# Patient Record
Sex: Male | Born: 1970 | Race: White | Hispanic: No | Marital: Married | Smoking: Never smoker
Health system: Southern US, Community
[De-identification: ages and names within clinical notes are randomized; demographics above are authoritative.]

## PROBLEM LIST (undated history)

## (undated) DIAGNOSIS — R519 Headache, unspecified: Secondary | ICD-10-CM

## (undated) DIAGNOSIS — R51 Headache: Secondary | ICD-10-CM

## (undated) HISTORY — DX: Headache, unspecified: R51.9

## (undated) HISTORY — DX: Headache: R51

---

## 2015-12-29 ENCOUNTER — Encounter: Payer: Self-pay | Admitting: Podiatry

## 2015-12-29 ENCOUNTER — Ambulatory Visit (INDEPENDENT_AMBULATORY_CARE_PROVIDER_SITE_OTHER): Payer: 59 | Admitting: Podiatry

## 2015-12-29 VITALS — BP 147/90 | HR 70 | Ht 74.0 in | Wt 210.0 lb

## 2015-12-29 DIAGNOSIS — M216X9 Other acquired deformities of unspecified foot: Secondary | ICD-10-CM | POA: Insufficient documentation

## 2015-12-29 DIAGNOSIS — M722 Plantar fascial fibromatosis: Secondary | ICD-10-CM | POA: Diagnosis not present

## 2015-12-29 DIAGNOSIS — M21969 Unspecified acquired deformity of unspecified lower leg: Secondary | ICD-10-CM | POA: Insufficient documentation

## 2015-12-29 NOTE — Patient Instructions (Signed)
Seen for right heel pain. Reviewed findings.  Need custom orthotics or OTC.

## 2015-12-29 NOTE — Progress Notes (Signed)
  SUBJECTIVE: 45 y.o. year old male presents complaining of right heel pain at plantar medial since September (4 months). On feet at work, 5-6 hours. Has had plantar fasciitis, pain in the mornging and gets better after stretch. This was about 4 years ago. Seen podiatrist at that time and was placed in strapping and cortisone injection.  Now the pain is more after been on feet and shoots up to Achilles and lateral aspect of calf area. Has history of frequent ankle sprain as a child.   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 Neurological: negative Endocrine: negative Allergic/Immunologic: negative  OBJECTIVE: DERMATOLOGIC EXAMINATION: No open skin lesions. Positive of firm palpable mass, 1x2 cm  plantar medial aspect at the level of 1st Metatarsal neck left foot. Not painful.   VASCULAR EXAMINATION OF LOWER LIMBS: Pedal pulses: All pedal pulses are palpable with normal pulsation.  Temperature gradient from tibial crest to dorsum of foot is within normal bilateral.  NEUROLOGIC EXAMINATION OF THE LOWER LIMBS: All epicritic and tactile sensations grossly intact.   MUSCULOSKELETAL EXAMINATION: Positive of Ligamentous laxity. Positive of hypermobile first ray bilateral. Plantar medial heel pain right foot.  ASSESSMENT: Plantar fasciitis right. Hypermobile first ray bilateral. STJ hyperpronation bilateral.  PLAN: Reviewed clinical findings and available treatment options. Patient already has Metatarsal binder, which he will use. Return for custom orthotics.

## 2016-04-12 ENCOUNTER — Emergency Department
Admission: EM | Admit: 2016-04-12 | Discharge: 2016-04-12 | Disposition: A | Discharge status: Home / Self Care | Payer: 59 | Attending: Emergency Medicine | Admitting: Emergency Medicine

## 2016-04-12 DIAGNOSIS — R509 Fever, unspecified: Secondary | ICD-10-CM | POA: Insufficient documentation

## 2016-04-12 DIAGNOSIS — B349 Viral infection, unspecified: Secondary | ICD-10-CM

## 2016-04-12 NOTE — ED Notes (Signed)
Patient states chills since yesterday. Denies sore throat, chillls or dysuria.

## 2016-04-12 NOTE — ED Provider Notes (Signed)
[facility] [address]Emergency Department Provider Note ____________________________________________  Time seen: 1716  I have reviewed the triage vital signs and the nursing notes.  HISTORY  Chief Complaint  Fever  HPI Kent Floyd is a 45 y.o. male presents to the ED for evaluation of intermittent fevers since Sunday night. He describes a Tmax of 101 on Sunday night, and a onset this morning of fever at 100.23F. Patient did not receive the seasonal flu vaccine last year. He has also had intermittent headaches, as well as chills when the fevers spiked.He denies any nausea, vomiting, diarrhea, constipation, rashes. He denies any recent travel, sick contacts, bad food. He is been dosing BC powders for headaches and fevers with good response. His last dose was this morning at 8:30 AM.  History reviewed. No pertinent past medical history.  Patient Active Problem List   Diagnosis Date Noted  . Plantar fasciitis of right foot 12/29/2015  . 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 No pertinent past surgical history.  No current outpatient prescriptions on file.  Allergies Tree extract  No family history on file.  Social History Social History  Substance Use Topics  . Smoking status: Never Smoker   . Smokeless tobacco: Never Used  . Alcohol Use: 0.0 oz/week    0 Standard drinks or equivalent per week     Comment: socially   Review of Systems  Constitutional: Positive for fever. Eyes: Negative for visual changes. ENT: Negative for sore throat. Cardiovascular: Negative for chest pain. Respiratory: Negative for shortness of breath. Gastrointestinal: Negative for abdominal pain, vomiting and diarrhea. Genitourinary: Negative for dysuria. Musculoskeletal: Negative for back pain. Skin: Negative for rash. Neurological: Negative for headaches, focal weakness or  numbness. ____________________________________________  PHYSICAL EXAM:  VITAL SIGNS: ED Triage Vitals  Enc Vitals Group     BP 04/12/16 1546 137/95 mmHg     Pulse Rate 04/12/16 1546 111     Resp 04/12/16 1546 18     Temp 04/12/16 1546 98.9 F (37.2 C)     Temp Source 04/12/16 1546 Oral     SpO2 04/12/16 1546 97 %     Weight 04/12/16 1546 200 lb (90.719 kg)     Height 04/12/16 1546 6\' 2"  (1.88 m)     Head Cir --      Peak Flow --      Pain Score 04/12/16 1742 0     Pain Loc --      Pain Edu? --      Excl. in [place]? --    Constitutional: Alert and oriented. Well appearing and in no distress. Head: Normocephalic and atraumatic.      Eyes: Conjunctivae are normal. PERRL. Normal extraocular movements      Ears: Canals clear. TMs intact bilaterally.   Nose: No congestion/rhinorrhea.   Mouth/Throat: Mucous membranes are moist.   Neck: Supple. No thyromegaly. Hematological/Lymphatic/Immunological: No cervical lymphadenopathy. Cardiovascular: Normal rate, regular rhythm.  Respiratory: Normal respiratory effort. No wheezes/rales/rhonchi. Gastrointestinal: Soft and nontender. No distention, rebound, guarding, or organomegaly. Musculoskeletal: Nontender with normal range of motion in all extremities.  Neurologic:  Normal gait without ataxia. Normal speech and language. No gross focal neurologic deficits are appreciated. Skin:  Skin is warm, dry and intact. No rash noted. ____________________________________________  INITIAL IMPRESSION / ASSESSMENT AND PLAN / ED COURSE  Patient with an apparent febrile illness which is likely viral in nature. He may affect have influenza however he is declined  testing at this time. Patient will be discharged with instructions to continue to monitor and treat fevers as appropriate. He should increase fluid intake to prevent dehydration. He Street any other symptoms as necessary and follow-up with [facility] for ongoing symptom management. He should return  to the ED as needed for worsening symptoms as discussed. ____________________________________________  FINAL CLINICAL IMPRESSION(S) / ED DIAGNOSES  Final diagnoses:  Nonspecific syndrome suggestive of viral illness      Melvenia Needles, PA-C 04/12/16 1830  Lisa Roca, MD 04/12/16 2026

## 2016-04-12 NOTE — Discharge Instructions (Signed)
Viral Infections A viral infection can be caused by different types of viruses.Most viral infections are not serious and resolve on their own. However, some infections may cause severe symptoms and may lead to further complications. SYMPTOMS Viruses can frequently cause:  Minor sore throat.  Aches and pains.  Headaches.  Runny nose.  Different types of rashes.  Watery eyes.  Tiredness.  Cough.  Loss of appetite.  Gastrointestinal infections, resulting in nausea, vomiting, and diarrhea. These symptoms do not respond to antibiotics because the infection is not caused by bacteria. However, you might catch a bacterial infection following the viral infection. This is sometimes called a "superinfection." Symptoms of such a bacterial infection may include:  Worsening sore throat with pus and difficulty swallowing.  Swollen neck glands.  Chills and a high or persistent fever.  Severe headache.  Tenderness over the sinuses.  Persistent overall ill feeling (malaise), muscle aches, and tiredness (fatigue).  Persistent cough.  Yellow, green, or brown mucus production with coughing. HOME CARE INSTRUCTIONS   Only take over-the-counter or prescription medicines for pain, discomfort, diarrhea, or fever as directed by your caregiver.  Drink enough water and fluids to keep your urine clear or pale yellow. Sports drinks can provide valuable electrolytes, sugars, and hydration.  Get plenty of rest and maintain proper nutrition. Soups and broths with crackers or rice are fine. SEEK IMMEDIATE MEDICAL CARE IF:   You have severe headaches, shortness of breath, chest pain, neck pain, or an unusual rash.  You have uncontrolled vomiting, diarrhea, or you are unable to keep down fluids.  You or your child has an oral temperature above 102 F (38.9 C), not controlled by medicine.  Your baby is older than 3 months with a rectal temperature of 102 F (38.9 C) or higher.  Your baby is 97  months old or younger with a rectal temperature of 100.4 F (38 C) or higher. MAKE SURE YOU:   Understand these instructions.  Will watch your condition.  Will get help right away if you are not doing well or get worse.   This information is not intended to replace advice given to you by your health care provider. Make sure you discuss any questions you have with your health care provider.   Document Released: 09/15/2005 Document Revised: 02/28/2012 Document Reviewed: 05/14/2015 Elsevier Interactive Patient Education 2016 Ten Sleep exam is essentially normal. You may, in fact, have influenza. Continue to monitor symptoms and treat fevers as needed. Follow-up with [facility] or return as needed.

## 2016-04-12 NOTE — ED Notes (Signed)
Pt c/o fever bodyaches since yesterday, states temp at home was 100.6-101, non febrile in triage

## 2016-04-12 NOTE — ED Notes (Signed)
PA Jenise informed of incr temperature, pt advised to take tylenol/ibuprofen for fever.  Pt cleared for D/C.

## 2016-10-28 ENCOUNTER — Ambulatory Visit (INDEPENDENT_AMBULATORY_CARE_PROVIDER_SITE_OTHER): Payer: 59 | Admitting: Sports Medicine

## 2016-11-02 ENCOUNTER — Ambulatory Visit (INDEPENDENT_AMBULATORY_CARE_PROVIDER_SITE_OTHER): Payer: 59 | Admitting: Sports Medicine

## 2017-08-05 ENCOUNTER — Encounter (HOSPITAL_COMMUNITY): Payer: Self-pay | Admitting: Emergency Medicine

## 2017-08-05 ENCOUNTER — Ambulatory Visit (HOSPITAL_COMMUNITY)
Admission: EM | Admit: 2017-08-05 | Discharge: 2017-08-05 | Disposition: A | Discharge status: Home / Self Care | Payer: Managed Care, Other (non HMO) | Attending: Family Medicine | Admitting: Family Medicine

## 2017-08-05 DIAGNOSIS — R6883 Chills (without fever): Secondary | ICD-10-CM

## 2017-08-05 DIAGNOSIS — R110 Nausea: Secondary | ICD-10-CM | POA: Diagnosis not present

## 2017-08-05 LAB — POCT I-STAT, CHEM 8
BUN: 11 mg/dL (ref 6–20)
CALCIUM ION: 1.13 mmol/L — AB (ref 1.15–1.40)
CHLORIDE: 103 mmol/L (ref 101–111)
CREATININE: 0.9 mg/dL (ref 0.61–1.24)
GLUCOSE: 115 mg/dL — AB (ref 65–99)
HCT: 45 % (ref 39.0–52.0)
HEMOGLOBIN: 15.3 g/dL (ref 13.0–17.0)
Potassium: 3.8 mmol/L (ref 3.5–5.1)
Sodium: 140 mmol/L (ref 135–145)
TCO2: 26 mmol/L (ref 0–100)

## 2017-08-05 LAB — POCT URINALYSIS DIP (DEVICE)
Bilirubin Urine: NEGATIVE
GLUCOSE, UA: NEGATIVE mg/dL
Hgb urine dipstick: NEGATIVE
Ketones, ur: 80 mg/dL — AB
Leukocytes, UA: NEGATIVE
Nitrite: NEGATIVE
PROTEIN: NEGATIVE mg/dL
SPECIFIC GRAVITY, URINE: 1.015 (ref 1.005–1.030)
UROBILINOGEN UA: 0.2 mg/dL (ref 0.0–1.0)
pH: 7.5 (ref 5.0–8.0)

## 2017-08-05 NOTE — ED Triage Notes (Signed)
Pt c/o intermittent weakness onset 1 week associated w/shakiness, chills and intermittent rash on hands and feet  Denies fevers, cold sx  Drinking Gatorade and alka seltzer w/temp relief.   A&O x4.... NAD... Ambulatory

## 2017-08-05 NOTE — ED Provider Notes (Signed)
[place]    CSN: [id number] Arrival date & time: 08/05/17  1003     History   Chief Complaint Chief Complaint  Patient presents with  . Weakness    HPI Kent Floyd is a 46 y.o. male.   46 year old male comes in for 2 shaking/chills/weakness episodes in the past week. He states that it has happened twice when he woke up at night shaking, with chills, and nauseous feeling. He denies any chest pain, shortness of breath, palpitations. Denies dizziness, syncope. States that he used Alka-Seltzer p.m. which helped with him falling back asleep. He states the next day he felt slightly nauseous, but symptoms had relatively resolved. He states drinking fluids seem to help, so he tried to stay as hydrated as possible. Second episode was last night, where again he felt shaking and chills. Denies abdominal pain, vomiting, diarrhea, constipation. Denies urinary changes such as frequency, dysuria, hematuria. Denies recent bug bite/tick bites.      History reviewed. No pertinent past medical history.  Patient Active Problem List   Diagnosis Date Noted  . Plantar fasciitis of right foot 12/29/2015  . 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 No pertinent surgical history.     Home Medications    Prior to Admission medications   Not on File    Family History History reviewed. No pertinent family history.  Social History Social History  Substance Use Topics  . Smoking status: Never Smoker  . Smokeless tobacco: Never Used  . Alcohol use 0.0 oz/week     Comment: socially     Allergies   Tree extract   Review of Systems Review of Systems  Reason unable to perform ROS: See HPI as above.     Physical Exam Triage Vital Signs ED Triage Vitals [08/05/17 1034]  Enc Vitals Group     BP 140/85     Pulse Rate 76     Resp 20     Temp 98.9 F (37.2 C)     Temp Source Oral     SpO2 100 %   Weight      Height      Head Circumference      Peak Flow      Pain Score      Pain Loc      Pain Edu?      Excl. in [place]?    Orthostatic VS for the past 24 hrs:  BP- Lying Pulse- Lying BP- Sitting Pulse- Sitting BP- Standing at 0 minutes Pulse- Standing at 0 minutes  08/05/17 1118 142/83 68 137/90 75 125/86 81    Updated Vital Signs BP 140/85 (BP Location: Left Arm)   Pulse 76   Temp 98.9 F (37.2 C) (Oral)   Resp 20   SpO2 100%      Physical Exam  Constitutional: He is oriented to person, place, and time. He appears well-developed and well-nourished. No distress.  HENT:  Head: Normocephalic and atraumatic.  Eyes: Pupils are equal, round, and reactive to light. Conjunctivae and EOM are normal.  Cardiovascular: Normal rate, regular rhythm and normal heart sounds.  Exam reveals no gallop and no friction rub.   No murmur heard. Pulmonary/Chest: Effort normal and breath sounds normal. He has no wheezes. He has no rales.  Neurological: He is alert and oriented to person, place, and time. He has normal strength. He is not disoriented. No cranial nerve deficit or sensory deficit. He displays  a negative Romberg sign.  Skin: Skin is warm and dry.     UC Treatments / Results  Labs (all labs ordered are listed, but only abnormal results are displayed) Labs Reviewed  POCT I-STAT, CHEM 8 - Abnormal; Notable for the following:       Result Value   Glucose, Bld 115 (*)    Calcium, Ion 1.13 (*)    All other components within normal limits  POCT URINALYSIS DIP (DEVICE) - Abnormal; Notable for the following:    Ketones, ur 80 (*)    All other components within normal limits    EKG  EKG Interpretation None       Radiology No results found.  Procedures Procedures (including critical care time)  Medications Ordered in UC Medications - No data to display   Initial Impression / Assessment and Plan / UC Course  I have reviewed the triage vital signs and the nursing  notes.  Pertinent labs & imaging results that were available during my care of the patient were reviewed by me and considered in my medical decision making (see chart for details).    Discussed lab results with patient. Given testing were within normal limits, patient without acute distress and without symptoms currently, discussed with patient to continue to monitor symptoms. Orthostatic blood pressure indicated possible dehydration, patient to push fluids. Return precautions given. Resources for PCP given.   Discussed case with attending, who agrees to plan.   Final Clinical Impressions(s) / UC Diagnoses   Final diagnoses:  Shaking chills    New Prescriptions New Prescriptions   No medications on file      Arturo Morton 08/05/17 1234

## 2017-08-05 NOTE — Discharge Instructions (Signed)
Your blood test and urine test were negative for any acute processes. You could have a viral illness that is causing the symptoms. Your blood pressure showed that you are slightly dehydrated. Drink fluids, your urine should be clear to pale yellow in color. Take tylenol/motrin for pain and fever. Monitor for any changes in symptoms, worsening symptoms, abdominal pain, nausea, vomiting, weakness, dizziness, follow up for reevaluation.

## 2017-10-19 ENCOUNTER — Ambulatory Visit
Admission: RE | Admit: 2017-10-19 | Discharge: 2017-10-19 | Disposition: A | Discharge status: Home / Self Care | Payer: Managed Care, Other (non HMO) | Source: Ambulatory Visit | Attending: Family Medicine | Admitting: Family Medicine

## 2017-10-19 ENCOUNTER — Other Ambulatory Visit: Payer: Self-pay | Admitting: Family Medicine

## 2017-10-19 DIAGNOSIS — M549 Dorsalgia, unspecified: Secondary | ICD-10-CM

## 2017-10-19 IMAGING — CR DG THORACIC SPINE 3V
3 series · 3 of 3 positions shown · non-contrast
Comparison: None.

CLINICAL DATA: Back pain.  No known injury.

EXAM:
THORACIC SPINE - 3 VIEWS

[t t-spine a.p.]
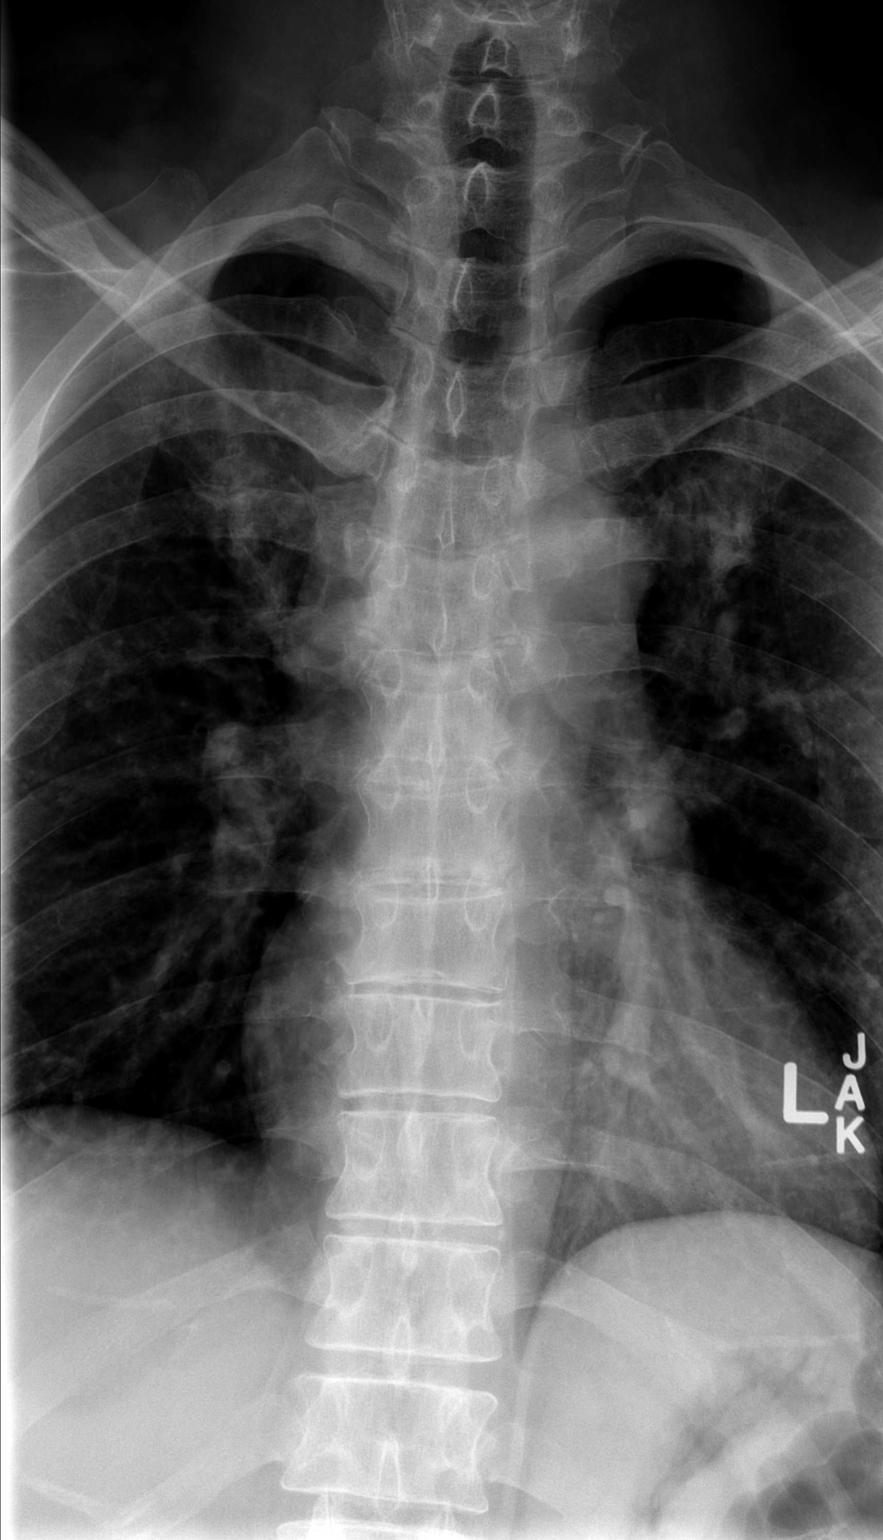

[t t-spine lat *]
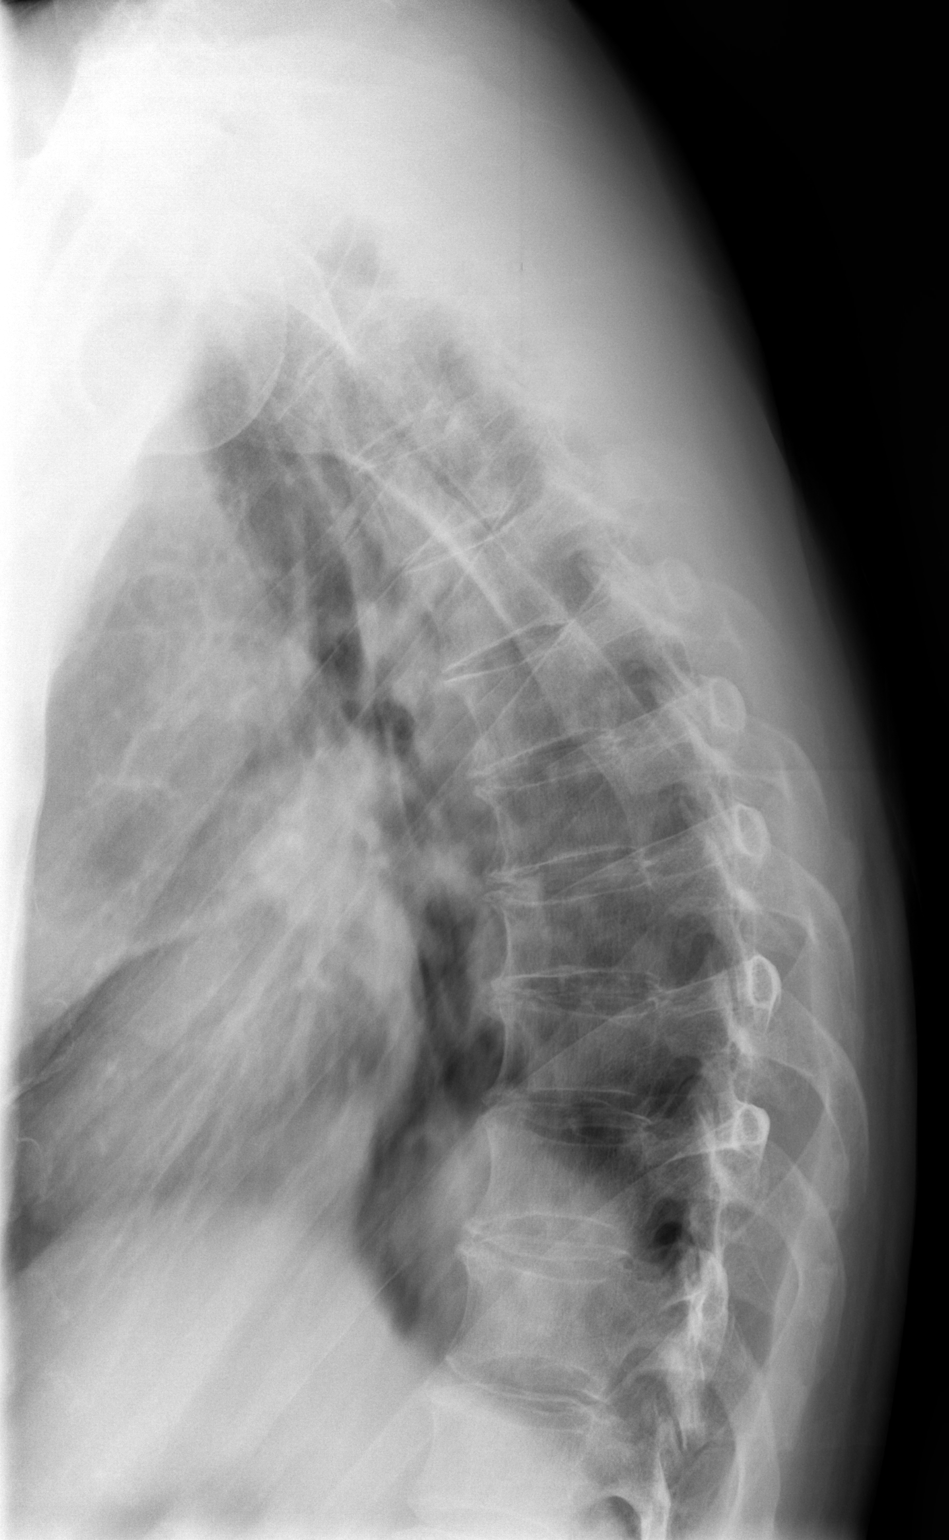

[t swimmers]
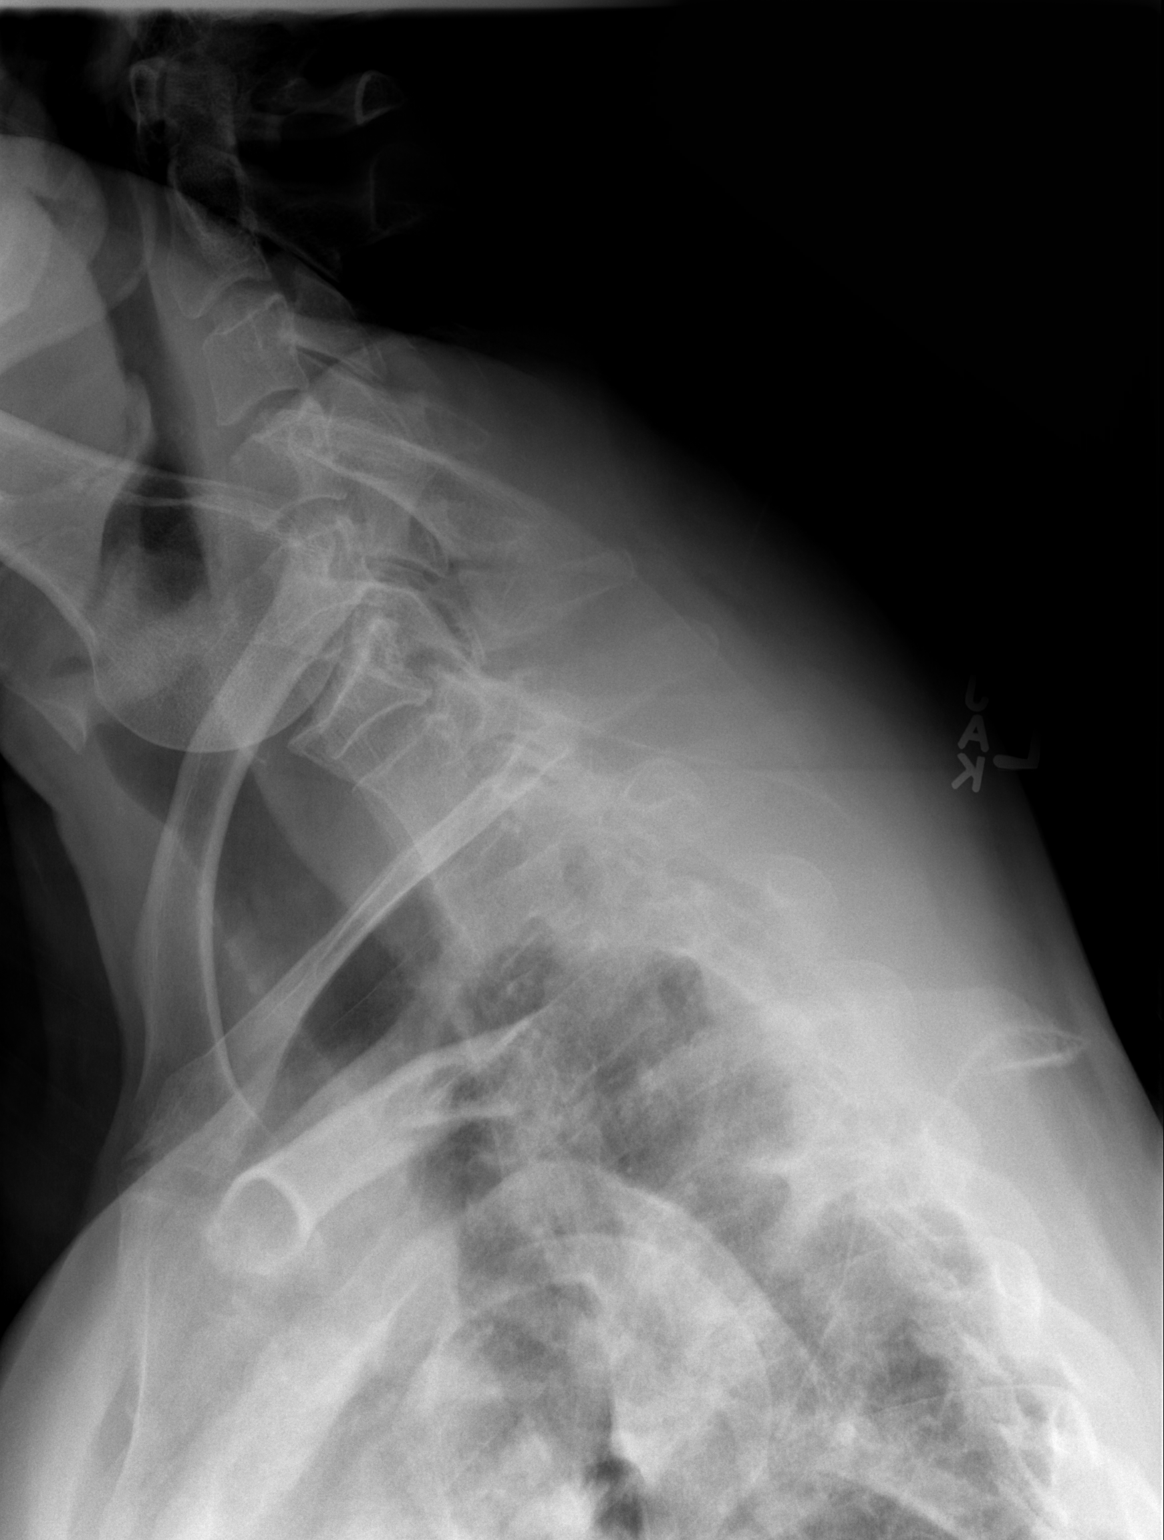

[3 of 3 positions shown; findings below may reference images not displayed]

FINDINGS: There is no evidence of thoracic spine fracture. Alignment is
normal. No other significant bone abnormalities are identified.
IMPRESSION: Negative.

## 2017-10-24 ENCOUNTER — Ambulatory Visit
Admission: RE | Admit: 2017-10-24 | Discharge: 2017-10-24 | Disposition: A | Discharge status: Home / Self Care | Payer: Managed Care, Other (non HMO) | Source: Ambulatory Visit | Attending: Family Medicine | Admitting: Family Medicine

## 2017-10-24 ENCOUNTER — Other Ambulatory Visit: Payer: Self-pay | Admitting: Family Medicine

## 2017-10-24 DIAGNOSIS — M542 Cervicalgia: Secondary | ICD-10-CM

## 2018-11-30 ENCOUNTER — Encounter: Payer: Self-pay | Admitting: Neurology

## 2018-11-30 ENCOUNTER — Telehealth: Payer: Self-pay | Admitting: Neurology

## 2018-11-30 ENCOUNTER — Ambulatory Visit (INDEPENDENT_AMBULATORY_CARE_PROVIDER_SITE_OTHER): Payer: Managed Care, Other (non HMO) | Admitting: Neurology

## 2018-11-30 VITALS — BP 150/93 | HR 83 | Ht 74.0 in | Wt 214.8 lb

## 2018-11-30 DIAGNOSIS — R51 Headache: Secondary | ICD-10-CM | POA: Diagnosis not present

## 2018-11-30 DIAGNOSIS — R519 Headache, unspecified: Secondary | ICD-10-CM

## 2018-11-30 MED ORDER — PROPRANOLOL HCL ER 60 MG PO CP24: 60.0000 mg | ORAL_CAPSULE | Freq: Every day | ORAL | 11 refills | Status: DC

## 2018-11-30 NOTE — Telephone Encounter (Signed)
Patient is aware and I gave him GI phone number of [phone] and to give them a call if he has not heard in the next 2-3 business days.

## 2018-11-30 NOTE — Telephone Encounter (Signed)
[employer] order sent to GI. They obtain the auth and will reach out to the pt to schedule.  °

## 2018-11-30 NOTE — Progress Notes (Signed)
PATIENT: Kent Floyd DOB: 23-Aug-1971  Chief Complaint  Patient presents with  . 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  The headaches sometimes linger and he takes ibuprofen.  At times, he has neck tightness/pain.  He has previously had a normal x-rays of his neck.    Marland Kitchen PCP    Lujean Amel, MD     HISTORICAL  Kent Floyd is a 47 year old male, seen in request by his primary care physician Dr. Dorthy Cooler, [place] for evaluation of chronic migraine headaches, initial evaluation was on November 30, 2018.  I have reviewed and summarized the referring note from the referring physician.  He denies previous history of headaches, began to experience intermittent headache since summer 2019, pulling sensation at his neck and the back part of his head, also occasionally bilateral frontal temporal region pressure headaches, it can last minute or hours, sometimes throbbing,   On December 02, 2018, he also developed intercourse headaches, severe pounding lasting for few hours, next day when he bending over, he has intermittent headaches, constant right ear tinnitus,  REVIEW OF SYSTEMS: Full 14 system review of systems performed and notable only for headache, ringing in ears. All other review of systems were negative.  ALLERGIES: Allergies  Allergen Reactions  . Tree Extract Other (See Comments)    Throat itching    HOME MEDICATIONS: Current Outpatient Medications  Medication Sig Dispense Refill  . IBUPROFEN PO Take 200 mg by mouth as needed.     No current facility-administered medications for this visit.     PAST MEDICAL HISTORY: Past Medical History:  Diagnosis Date  . Headache     PAST SURGICAL HISTORY: History reviewed. No pertinent surgical history.  FAMILY HISTORY: Family History  Problem Relation Age of Onset  . Healthy Mother   . Skin cancer Father      SOCIAL HISTORY: Social History   Socioeconomic History  . Marital status: Married    Spouse name: Not on file  . Number of children: 2  . Years of education: college  . Highest education level: Bachelor's degree (e.g., BA, AB, BS)  Occupational History  . Occupation: Software engineer  . Financial resource strain: Not on file  . Food insecurity:    Worry: Not on file    Inability: Not on file  . Transportation needs:    Medical: Not on file    Non-medical: Not on file  Tobacco Use  . Smoking status: Never Smoker  . Smokeless tobacco: Never Used  Substance and Sexual Activity  . Alcohol use: Yes    Alcohol/week: 0.0 standard drinks    Comment: 2 drinks per month  . Drug use: No  . Sexual activity: Not on file  Lifestyle  . Physical activity:    Days per week: Not on file    Minutes per session: Not on file  . Stress: Not on file  Relationships  . Social connections:    Talks on phone: Not on file    Gets together: Not on file    Attends religious service: Not on file    Active member of club or organization: Not on file    Attends meetings of clubs or organizations: Not on file    Relationship status: Not on file  . Intimate partner violence:    Fear of current or ex partner: Not on file    Emotionally abused:  Not on file    Physically abused: Not on file    Forced sexual activity: Not on file  Other Topics Concern  . Not on file  Social History Narrative   Lives at home with his wife and child.   Right-handed.   Caffeine per day: cup of coffee in morning, glass of tea at lunch and dinner     PHYSICAL EXAM   Vitals:   11/30/18 1107  BP: (!) 150/93  Pulse: 83  Weight: 214 lb 12 oz (97.4 kg)  Height: 6\' 2"  (1.88 m)    Not recorded      Body mass index is 27.57 kg/m.  PHYSICAL EXAMNIATION:  Gen: NAD, conversant, well nourised, obese, well groomed                     Cardiovascular: Regular rate rhythm, no peripheral edema, warm,  nontender. Eyes: Conjunctivae clear without exudates or hemorrhage Neck: Supple, no carotid bruits. Pulmonary: Clear to auscultation bilaterally   NEUROLOGICAL EXAM:  MENTAL STATUS: Speech:    Speech is normal; fluent and spontaneous with normal comprehension.  Cognition:     Orientation to time, place and person     Normal recent and remote memory     Normal Attention span and concentration     Normal Language, naming, repeating,spontaneous speech     Fund of knowledge   CRANIAL NERVES: CN II: Visual fields are full to confrontation. Fundoscopic exam is normal with sharp discs and no vascular changes. Pupils are round equal and briskly reactive to light. CN III, IV, VI: extraocular movement are normal. No ptosis. CN V: Facial sensation is intact to pinprick in all 3 divisions bilaterally. Corneal responses are intact.  CN VII: Face is symmetric with normal eye closure and smile. CN VIII: Hearing is normal to rubbing fingers CN IX, X: Palate elevates symmetrically. Phonation is normal. CN XI: Head turning and shoulder shrug are intact CN XII: Tongue is midline with normal movements and no atrophy.  MOTOR: There is no pronator drift of out-stretched arms. Muscle bulk and tone are normal. Muscle strength is normal.  REFLEXES: Reflexes are 2+ and symmetric at the biceps, triceps, knees, and ankles. Plantar responses are flexor.  SENSORY: Intact to light touch, pinprick, positional sensation and vibratory sensation are intact in fingers and toes.  COORDINATION: Rapid alternating movements and fine finger movements are intact. There is no dysmetria on finger-to-nose and heel-knee-shin.    GAIT/STANCE: Posture is normal. Gait is steady with normal steps, base, arm swing, and turning. Heel and toe walking are normal. Tandem gait is normal.  Romberg is absent.   DIAGNOSTIC DATA (LABS, IMAGING, TESTING) - I reviewed patient records, labs, notes, testing and imaging myself where  available.   ASSESSMENT AND PLAN  Kent Floyd is a 47 y.o. male   New onset headaches  MRI of the brain to rule out structural lesion  Inderal LA 60 mg every day as preventive medications   Marcial Pacas, M.D. Ph.D.  [facility] [address], [place], [place] [address] Ph: [phone] Fax: [phone]  CC: Lujean Amel, MD

## 2018-12-16 ENCOUNTER — Ambulatory Visit
Admission: RE | Admit: 2018-12-16 | Discharge: 2018-12-16 | Disposition: A | Discharge status: Home / Self Care | Payer: Managed Care, Other (non HMO) | Source: Ambulatory Visit | Attending: Neurology | Admitting: Neurology

## 2018-12-16 DIAGNOSIS — R519 Headache, unspecified: Secondary | ICD-10-CM

## 2018-12-16 DIAGNOSIS — R51 Headache: Secondary | ICD-10-CM | POA: Diagnosis not present

## 2018-12-18 ENCOUNTER — Other Ambulatory Visit: Payer: Managed Care, Other (non HMO)

## 2018-12-18 NOTE — Telephone Encounter (Signed)
Novella Rob: [id number] (exp. 12/11/18 to 03/11/19) patient had her MRI at GI on 12/16/18.

## 2018-12-21 ENCOUNTER — Telehealth: Payer: Self-pay | Admitting: *Deleted

## 2018-12-21 NOTE — Telephone Encounter (Signed)
-----   Message from Penni Bombard, MD sent at 12/21/2018 11:30 AM EST ----- Normal brain. Mild left frontal sinus finding (incidental; could be normal variant). Recommend follow up CT head (without). -VRP

## 2018-12-21 NOTE — Telephone Encounter (Signed)
Spoke to patient - he is aware of his MRI results.  He would like to hold off on the CT head without for now.  He will keep his pending follow up on 03/01/2019 for further discussion.

## 2018-12-21 NOTE — Progress Notes (Signed)
Normal brain. Mild finding in left frontal sinus (likely incidental). Recommend follow up with CT head (without). -VRP

## 2019-01-31 ENCOUNTER — Encounter: Payer: Self-pay | Admitting: Neurology

## 2019-02-28 ENCOUNTER — Other Ambulatory Visit: Payer: Self-pay

## 2019-02-28 ENCOUNTER — Ambulatory Visit (INDEPENDENT_AMBULATORY_CARE_PROVIDER_SITE_OTHER): Payer: Managed Care, Other (non HMO) | Admitting: Neurology

## 2019-02-28 ENCOUNTER — Encounter: Payer: Self-pay | Admitting: Neurology

## 2019-02-28 VITALS — BP 139/88 | HR 81 | Ht 74.0 in | Wt 216.6 lb

## 2019-02-28 DIAGNOSIS — R51 Headache: Secondary | ICD-10-CM | POA: Diagnosis not present

## 2019-02-28 DIAGNOSIS — R519 Headache, unspecified: Secondary | ICD-10-CM

## 2019-02-28 NOTE — Progress Notes (Addendum)
PATIENT: Kent Floyd DOB: [birth year]  REASON FOR VISIT: follow up HISTORY FROM: patient  HISTORY OF PRESENT ILLNESS: Today 02/28/19  HISTORY  Kent Floyd is a 48 year old male, seen in request by his primary care physician Dr. Dorthy Cooler, Dibas for evaluation of chronic migraine headaches, initial evaluation was on November 30, 2018.  I have reviewed and summarized the referring note from the referring physician.  He denies previous history of headaches, began to experience intermittent headache since summer 2019, pulling sensation at his neck and the back part of his head, also occasionally bilateral frontal temporal region pressure headaches, it can last minute or hours, sometimes throbbing,   On December 02, 2018, he also developed intercourse headaches, severe pounding lasting for few hours, next day when he bending over, he has intermittent headaches, constant right ear tinnitus,  Update February 28, 2019 SS: He was started on Inderal LA 60 mg daily for migraine prevention.  MRI of the brain in January 2020 was normal.  Mild left frontal sinus finding, incidental, could be normal variant recommend CT head (without).  He reports he is doing well and has not had any headaches since starting the Inderal.  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  He immediately noticed that the pulling in his neck went away.  He denies any new problems or concerns.  He presents for follow-up unaccompanied.  He reports he is tolerating medication well.  He reports follow-up with his primary care doctor and has a physical coming up in April 2020.   REVIEW OF SYSTEMS: Out of a complete 14 system review of symptoms, the patient complains only of the following symptoms, and all other reviewed systems are negative..  Environmental allergies  ALLERGIES: Allergies  Allergen Reactions  . Tree Extract  Other (See Comments)    Throat itching    HOME MEDICATIONS: Outpatient Medications Prior to Visit  Medication Sig Dispense Refill  . IBUPROFEN PO Take 200 mg by mouth as needed.    . propranolol ER (INDERAL LA) 60 MG 24 hr capsule Take 1 capsule (60 mg total) by mouth daily. 30 capsule 11   No facility-administered medications prior to visit.     PAST MEDICAL HISTORY: Past Medical History:  Diagnosis Date  . Headache     PAST SURGICAL HISTORY: No past surgical history on file.  FAMILY HISTORY: Family History  Problem Relation Age of Onset  . Healthy Mother   . Skin cancer Father     SOCIAL HISTORY: Social History   Socioeconomic History  . Marital status: Married    Spouse name: Not on file  . Number of children: 2  . Years of education: college  . Highest education level: Bachelor's degree (e.g., BA, AB, BS)  Occupational History  . Occupation: Software engineer  . Financial resource strain: Not on file  . Food insecurity:    Worry: Not on file    Inability: Not on file  . Transportation needs:    Medical: Not on file    Non-medical: Not on file  Tobacco Use  . Smoking status: Never Smoker  . Smokeless tobacco: Never Used  Substance and Sexual Activity  . Alcohol use: Yes    Alcohol/week: 0.0 standard drinks    Comment: 2 drinks per month  . Drug use: No  . Sexual activity: Not on file  Lifestyle  . Physical activity:  Days per week: Not on file    Minutes per session: Not on file  . Stress: Not on file  Relationships  . Social connections:    Talks on phone: Not on file    Gets together: Not on file    Attends religious service: Not on file    Active member of club or organization: Not on file    Attends meetings of clubs or organizations: Not on file    Relationship status: Not on file  . Intimate partner violence:    Fear of current or ex partner: Not on file    Emotionally abused: Not on file    Physically abused: Not on file     Forced sexual activity: Not on file  Other Topics Concern  . Not on file  Social History Narrative   Lives at home with his wife and child.   Right-handed.   Caffeine per day: cup of coffee in morning, glass of tea at lunch and dinner      PHYSICAL EXAM  There were no vitals filed for this visit. There is no height or weight on file to calculate BMI.  Generalized: Well developed, in no acute distress   Neurological examination  Mentation: Alert oriented to time, place, history taking. Follows all commands speech and language fluent Cranial nerve II-XII: Pupils were equal round reactive to light. Extraocular movements were full, visual field were full on confrontational test. Facial sensation and strength were normal. Uvula tongue midline. Head turning and shoulder shrug  were normal and symmetric. Motor: The motor testing reveals 5 over 5 strength of all 4 extremities. Good symmetric motor tone is noted throughout.  Sensory: Sensory testing is intact to soft touch on all 4 extremities. No evidence of extinction is noted.  Coordination: Cerebellar testing reveals good finger-nose-finger and heel-to-shin bilaterally.  Gait and station: Gait is normal.  Reflexes: Deep tendon reflexes are symmetric and normal bilaterally.   DIAGNOSTIC DATA (LABS, IMAGING, TESTING) - I reviewed patient records, labs, notes, testing and imaging myself where available.  Lab Results  Component Value Date   HGB 15.3 08/05/2017   HCT 45.0 08/05/2017      Component Value Date/Time   NA 140 08/05/2017 1118   K 3.8 08/05/2017 1118   CL 103 08/05/2017 1118   GLUCOSE 115 (H) 08/05/2017 1118   BUN 11 08/05/2017 1118   CREATININE 0.90 08/05/2017 1118   No results found for: CHOL, HDL, LDLCALC, LDLDIRECT, TRIG, CHOLHDL No results found for: HGBA1C No results found for: VITAMINB12 No results found for: TSH  MRI of the Brain 12/16/2018 IMPRESSION:   Normal brain parenchyma. No acute findings.   Slightly asymmetric and prominent pneumatization of the left frontal sinus, with mild adjacent left frontal calvarial marrow edema. Correlate clinically.    ASSESSMENT AND PLAN 48 y.o. year old male  has a past medical history of Headache. here with:  1. Headache  He reports he is doing quite well and reports he has not had any headache since starting the Inderal.  He will continue taking Inderal LA 60 mg daily.  We did discuss the repeat CT scan for incidental finding on MRI however he deferred at this time.  He does report that he has really bad sinus abnormalities.  He does not wish to pursue CT scan at this time. I reviewed scan with Dr. Krista Blue.  He will follow-up in 1 year or sooner if needed I advised him that if his symptoms worsen or  if he develops any new symptoms he should let us know.   I spent 15 minutes with the patient. 50% of this time was spent discussing his plan of care.    Butler Denmark, AGNP-C, DNP 02/28/2019, 9:55 AM [facility] [address], [place], [place] [address] [phone]

## 2019-03-01 ENCOUNTER — Ambulatory Visit: Payer: Managed Care, Other (non HMO) | Admitting: Neurology

## 2019-03-06 NOTE — Progress Notes (Signed)
I have reviewed and agreed above plan. 

## 2020-02-26 ENCOUNTER — Telehealth: Payer: Self-pay | Admitting: Neurology

## 2020-02-26 NOTE — Telephone Encounter (Signed)
Phone rep checked office voicemail's, @ 10:28 pt left a message stating he needs to r/s his appointment.  Pt was called back and a voicemail asking that he calls to r/s his appointment. This is Pharmacist, hospital

## 2020-02-28 ENCOUNTER — Ambulatory Visit: Payer: Managed Care, Other (non HMO) | Admitting: Neurology

## 2020-02-29 ENCOUNTER — Ambulatory Visit: Payer: Managed Care, Other (non HMO) | Admitting: Neurology

## 2020-04-07 NOTE — Progress Notes (Signed)
PATIENT: Kent Floyd DOB: [birth year]  REASON FOR VISIT: follow up HISTORY FROM: patient  HISTORY OF PRESENT ILLNESS: Today 04/08/20  HISTORY Kent Lindleyis a 49 year old male, seen in request byhis primary care physician Dr.Koirala, Dibasfor evaluation of chronic migraine headaches, initial evaluation was on November 30, 2018.  I have reviewed and summarized the referring note from the referring physician.He denies previous history of headaches, began to experience intermittent headache since summer 2019, pullingsensation at his neck and the back part of his head, also occasionally bilateral frontal temporal region pressure headaches, it can last minute orhours, sometimes throbbing,   On December 02, 2018, he also developed intercourse headaches, severe pounding lasting for few hours, next day when he bending over, he has intermittent headaches, constant right ear tinnitus,  Update February 28, 2019 SS: He was started on Inderal LA 60 mg daily for migraine prevention.  MRI of the brain in January 2020 was normal.  Mild left frontal sinus finding, incidental, could be normal variant recommend CT head (without).  He reports he is doing well and has not had any headaches since starting the Inderal.  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  He immediately noticed that the pulling in his neck went away.  He denies any new problems or concerns.  He presents for follow-up unaccompanied.  He reports he is tolerating medication well.  He reports follow-up with his primary care doctor and has a physical coming up in April 2020.  Update April 08, 2020 SS: Here today for follow-up alone.  He stopped Inderal LA 60 mg daily, about 1 year ago.  He has identified triggers such as poor posture or positioning of his neck, that will trigger the headache.  Headache usually starts occipitally,  spreads forward.  Good benefit with ibuprofen.  He may have 1 headache every 2 months, he avoids triggers.  He denies neck pain, other than with overuse, no numbness or weakness of his arms or legs.  He works full-time, as a English as a second language teacher.  He had a physical last year, was canceled due to the pandemic.  REVIEW OF SYSTEMS: Out of a complete 14 system review of symptoms, the patient complains only of the following symptoms, and all other reviewed systems are negative.  Headache  ALLERGIES: Allergies  Allergen Reactions  . Tree Extract Other (See Comments)    Throat itching    HOME MEDICATIONS: Outpatient Medications Prior to Visit  Medication Sig Dispense Refill  . IBUPROFEN PO Take 200 mg by mouth as needed.    . loratadine (CLARITIN) 10 MG tablet Take 10 mg by mouth daily.    . mometasone (NASONEX) 50 MCG/ACT nasal spray Place 2 sprays into the nose daily.    . montelukast (SINGULAIR) 10 MG tablet Take 10 mg by mouth at bedtime.    . propranolol ER (INDERAL LA) 60 MG 24 hr capsule Take 1 capsule (60 mg total) by mouth daily. (Patient not taking: Reported on 04/08/2020) 30 capsule 11   No facility-administered medications prior to visit.    PAST MEDICAL HISTORY: Past Medical History:  Diagnosis Date  . Headache     PAST SURGICAL HISTORY: No past surgical history on file.  FAMILY HISTORY: Family History  Problem Relation Age of Onset  . Healthy Mother   . Skin cancer Father     SOCIAL HISTORY: Social History   Socioeconomic History  .  Marital status: Married    Spouse name: Not on file  . Number of children: 2  . Years of education: college  . Highest education level: Bachelor's degree (e.g., BA, AB, BS)  Occupational History  . Occupation: English as a second language teacher  Tobacco Use  . Smoking status: Never Smoker  . Smokeless tobacco: Never Used  Substance and Sexual Activity  . Alcohol use: Yes    Alcohol/week: 0.0 standard drinks    Comment: 2 drinks per month  . Drug use: No  .  Sexual activity: Not on file  Other Topics Concern  . Not on file  Social History Narrative   Lives at home with his wife and child.   Right-handed.   Caffeine per day: cup of coffee in morning, glass of tea at lunch and dinner   Social Determinants of Health   Financial Resource Strain:   . Difficulty of Paying Living Expenses:   Food Insecurity:   . Worried About Charity fundraiser in the Last Year:   . Arboriculturist in the Last Year:   Transportation Needs:   . Film/video editor (Medical):   Marland Kitchen Lack of Transportation (Non-Medical):   Physical Activity:   . Days of Exercise per Week:   . Minutes of Exercise per Session:   Stress:   . Feeling of Stress :   Social Connections:   . Frequency of Communication with Friends and Family:   . Frequency of Social Gatherings with Friends and Family:   . Attends Religious Services:   . Active Member of Clubs or Organizations:   . Attends Archivist Meetings:   Marland Kitchen Marital Status:   Intimate Partner Violence:   . Fear of Current or Ex-Partner:   . Emotionally Abused:   Marland Kitchen Physically Abused:   . Sexually Abused:       PHYSICAL EXAM  Vitals:   04/08/20 0749  BP: (!) 142/97  Pulse: 78  Temp: (!) 97.3 F (36.3 C)  Weight: 215 lb (97.5 kg)  Height: 6\' 2"  (1.88 m)   Body mass index is 27.6 kg/m.  Generalized: Well developed, in no acute distress   Neurological examination  Mentation: Alert oriented to time, place, history taking. Follows all commands speech and language fluent Cranial nerve II-XII: Pupils were equal round reactive to light. Extraocular movements were full, visual field were full on confrontational test. Facial sensation and strength were normal.  Head turning and shoulder shrug  were normal and symmetric. Motor: The motor testing reveals 5 over 5 strength of all 4 extremities. Good symmetric motor tone is noted throughout.  Sensory: Sensory testing is intact to soft touch on all 4 extremities.  No evidence of extinction is noted.  Coordination: Cerebellar testing reveals good finger-nose-finger and heel-to-shin bilaterally.  Gait and station: Gait is normal. Tandem gait is normal.   Reflexes: Deep tendon reflexes are symmetric and normal bilaterally.   DIAGNOSTIC DATA (LABS, IMAGING, TESTING) - I reviewed patient records, labs, notes, testing and imaging myself where available.  Lab Results  Component Value Date   HGB 15.3 08/05/2017   HCT 45.0 08/05/2017      Component Value Date/Time   NA 140 08/05/2017 1118   K 3.8 08/05/2017 1118   CL 103 08/05/2017 1118   GLUCOSE 115 (H) 08/05/2017 1118   BUN 11 08/05/2017 1118   CREATININE 0.90 08/05/2017 1118   No results found for: CHOL, HDL, LDLCALC, LDLDIRECT, TRIG, CHOLHDL No results found for: HGBA1C No results  found for: VITAMINB12 No results found for: TSH    ASSESSMENT AND PLAN 49 y.o. year old male  has a past medical history of Headache. here with:  1.  Headache -Stopped Inderal LA about 1 year ago, headaches well controlled, avoids known triggers, 1 headache every 2 months  -MRI of the brain was normal, showed mild left frontal sinus finding, recommend CT head without, however patient deferred, indicates chronic sinus issues  -Will discontinue Inderal LA 60 mg daily, may continue to take ibuprofen as needed for acute headache  -Follow-up at our office on an as-needed basis, if headaches return may reinitiate Inderal since good benefit, continue follow-up with PCP  I spent 20 minutes of face-to-face and non-face-to-face time with patient.  This included previsit chart review, lab review, study review, order entry, electronic health record documentation, patient education.  Butler Denmark, AGNP-C, DNP 04/08/2020, 7:52 AM [facility] [address] [place], [place] [address] [phone]

## 2020-04-08 ENCOUNTER — Other Ambulatory Visit: Payer: Self-pay

## 2020-04-08 ENCOUNTER — Ambulatory Visit: Payer: BC Managed Care – PPO | Admitting: Neurology

## 2020-04-08 ENCOUNTER — Encounter: Payer: Self-pay | Admitting: Neurology

## 2020-04-08 VITALS — BP 142/97 | HR 78 | Temp 97.3°F | Ht 74.0 in | Wt 215.0 lb

## 2020-04-08 DIAGNOSIS — R519 Headache, unspecified: Secondary | ICD-10-CM

## 2020-04-29 NOTE — Progress Notes (Signed)
I have reviewed and agreed above plan. 

## 2020-07-11 ENCOUNTER — Emergency Department
Admission: EM | Admit: 2020-07-11 | Discharge: 2020-07-11 | Disposition: A | Discharge status: Home / Self Care | Payer: BC Managed Care – PPO | Attending: Emergency Medicine | Admitting: Emergency Medicine

## 2020-07-11 ENCOUNTER — Emergency Department: Payer: BC Managed Care – PPO

## 2020-07-11 ENCOUNTER — Other Ambulatory Visit: Payer: Self-pay

## 2020-07-11 DIAGNOSIS — K219 Gastro-esophageal reflux disease without esophagitis: Secondary | ICD-10-CM

## 2020-07-11 DIAGNOSIS — R251 Tremor, unspecified: Secondary | ICD-10-CM | POA: Diagnosis not present

## 2020-07-11 DIAGNOSIS — R110 Nausea: Secondary | ICD-10-CM

## 2020-07-11 DIAGNOSIS — R109 Unspecified abdominal pain: Secondary | ICD-10-CM | POA: Diagnosis present

## 2020-07-11 LAB — URINALYSIS, COMPLETE (UACMP) WITH MICROSCOPIC
Bacteria, UA: NONE SEEN
Bilirubin Urine: NEGATIVE
Glucose, UA: NEGATIVE mg/dL
Hgb urine dipstick: NEGATIVE
Ketones, ur: NEGATIVE mg/dL
Leukocytes,Ua: NEGATIVE
Nitrite: NEGATIVE
Protein, ur: NEGATIVE mg/dL
Specific Gravity, Urine: 1.017 (ref 1.005–1.030)
pH: 6 (ref 5.0–8.0)

## 2020-07-11 LAB — COMPREHENSIVE METABOLIC PANEL
ALT: 19 U/L (ref 0–44)
AST: 17 U/L (ref 15–41)
Albumin: 4.2 g/dL (ref 3.5–5.0)
Alkaline Phosphatase: 60 U/L (ref 38–126)
Anion gap: 8 (ref 5–15)
BUN: 10 mg/dL (ref 6–20)
CO2: 27 mmol/L (ref 22–32)
Calcium: 9.2 mg/dL (ref 8.9–10.3)
Chloride: 105 mmol/L (ref 98–111)
Creatinine, Ser: 1.1 mg/dL (ref 0.61–1.24)
GFR calc Af Amer: >60 mL/min (ref >60)
GFR calc non Af Amer: >60 mL/min (ref >60)
Glucose, Bld: 123 mg/dL — ABNORMAL HIGH (ref 70–99)
Potassium: 4.1 mmol/L (ref 3.5–5.1)
Sodium: 140 mmol/L (ref 135–145)
Total Bilirubin: 0.8 mg/dL (ref 0.3–1.2)
Total Protein: 7.6 g/dL (ref 6.5–8.1)

## 2020-07-11 LAB — CBC
HCT: 44.9 % (ref 39.0–52.0)
Hemoglobin: 15.7 g/dL (ref 13.0–17.0)
MCH: 29.6 pg (ref 26.0–34.0)
MCHC: 35 g/dL (ref 30.0–36.0)
MCV: 84.7 fL (ref 80.0–100.0)
Platelets: 270 10*3/uL (ref 150–400)
RBC: 5.3 MIL/uL (ref 4.22–5.81)
RDW: 12.7 % (ref 11.5–15.5)
WBC: 6.3 10*3/uL (ref 4.0–10.5)
nRBC: 0 % (ref 0.0–0.2)

## 2020-07-11 LAB — LIPASE, BLOOD: Lipase: 37 U/L (ref 11–51)

## 2020-07-11 MED ORDER — IOHEXOL 300 MG/ML  SOLN
100.0000 mL | Freq: Once | INTRAMUSCULAR | Status: AC | PRN
  Administered 2020-07-11: 100 mL via INTRAVENOUS

## 2020-07-11 MED ORDER — LORAZEPAM 0.5 MG PO TABS: 0.5000 mg | ORAL_TABLET | Freq: Two times a day (BID) | ORAL | 0 refills | Status: DC

## 2020-07-11 MED ORDER — ONDANSETRON HCL 4 MG PO TABS: 4.0000 mg | ORAL_TABLET | Freq: Every day | ORAL | 1 refills | Status: DC | PRN

## 2020-07-11 MED ORDER — SODIUM CHLORIDE 0.9% FLUSH
3.0000 mL | Freq: Once | INTRAVENOUS | Status: AC
  Administered 2020-07-11: 3 mL via INTRAVENOUS

## 2020-07-11 MED ORDER — PANTOPRAZOLE SODIUM 40 MG PO TBEC: 40.0000 mg | DELAYED_RELEASE_TABLET | Freq: Every day | ORAL | 1 refills | Status: DC

## 2020-07-11 MED ORDER — LORAZEPAM 2 MG/ML IJ SOLN
1.0000 mg | Freq: Once | INTRAMUSCULAR | Status: AC
  Administered 2020-07-11: 1 mg via INTRAVENOUS
  Filled 2020-07-11: qty 1

## 2020-07-11 NOTE — ED Triage Notes (Signed)
Pt c/o nausea for the past month with "like rumbling constantly". Denies having any pain. States he was recently seen at an urgent care and Rx nexium.

## 2020-07-11 NOTE — ED Provider Notes (Signed)
  ER Provider Note       Time seen: 10:01 AM    I have reviewed the vital signs and the nursing notes.  HISTORY   Chief Complaint Abdominal Pain    HPI Kent Floyd is a 49 y.o. male with a history of headaches who presents today for nausea for the past month with rumbling constantly.  Patient denies having any pain, states he was recently seen in urgent care and prescribed Nexium.  Symptoms have not improved over the last month, feels shaky.  Past Medical History:  Diagnosis Date  . Headache     History reviewed. No pertinent surgical history.  Allergies Tree extract  Review of Systems Constitutional: Negative for fever. Cardiovascular: Negative for chest pain. Respiratory: Negative for shortness of breath. Gastrointestinal: Positive for nausea Musculoskeletal: Negative for back pain. Skin: Negative for rash. Neurological: Negative for headaches, focal weakness or numbness.  All systems negative/normal/unremarkable except as stated in the HPI  ____________________________________________   PHYSICAL EXAM:  VITAL SIGNS: Vitals:   07/11/20 0727 07/11/20 0730  BP: (!) 163/98   Pulse: 89   Resp: 18   Temp: 98.4 F (36.9 C) 97.8 F (36.6 C)    Constitutional: Alert and oriented. Well appearing and in no distress. Eyes: Conjunctivae are normal. Normal extraocular movements. ENT      Head: Normocephalic and atraumatic.      Nose: No congestion/rhinnorhea.      Mouth/Throat: Mucous membranes are moist.      Neck: No stridor. Cardiovascular: Normal rate, regular rhythm. No murmurs, rubs, or gallops. Respiratory: Normal respiratory effort without tachypnea nor retractions. Breath sounds are clear and equal bilaterally. No wheezes/rales/rhonchi. Gastrointestinal: Nonfocal lower abdominal tenderness Musculoskeletal: Nontender with normal range of motion in extremities. No lower extremity tenderness nor edema. Neurologic:  Normal speech and language. No gross  focal neurologic deficits are appreciated.  Tremulous Skin:  Skin is warm, dry and intact. No rash noted. Psychiatric: Speech and behavior are normal.  ____________________________________________   LABS (pertinent positives/negatives)  Labs Reviewed  COMPREHENSIVE METABOLIC PANEL - Abnormal; Notable for the following components:      Result Value   Glucose, Bld 123 (*)    All other components within normal limits  URINALYSIS, COMPLETE (UACMP) WITH MICROSCOPIC - Abnormal; Notable for the following components:   Color, Urine YELLOW (*)    APPearance CLEAR (*)    All other components within normal limits  LIPASE, BLOOD  CBC    RADIOLOGY  Images were viewed by me CT of the abdomen pelvis with contrast IMPRESSION: 1. No acute abnormality identified in the abdomen or pelvis. 2. 3 cm lesion in the right hepatic lobe consistent with a benign hemangioma.  DIFFERENTIAL DIAGNOSIS  Gastroenteritis, gastritis, peptic ulcer disease, dehydration, electrolyte abnormality, vaccine reaction  ASSESSMENT AND PLAN  Nausea, GERD, tremor   Plan: The patient had presented for nausea with abdominal pain and tremulousness. Patient's labs were reassuring.  No specific etiology for his symptoms was discovered.  He will be referred to GI for close outpatient follow-up.  Lenise Arena MD    Note: This note was generated in part or whole with voice recognition software. Voice recognition is usually quite accurate but there are transcription errors that can and very often do occur. I apologize for any typographical errors that were not detected and corrected.     Earleen Newport, MD 07/11/20 1128

## 2020-07-17 ENCOUNTER — Telehealth: Payer: Self-pay | Admitting: Gastroenterology

## 2020-07-17 NOTE — Telephone Encounter (Signed)
LVM for patient To call our office to reschedule 10/06/20 appt due to Doctor's schedule.

## 2020-07-18 ENCOUNTER — Telehealth: Payer: Self-pay | Admitting: Gastroenterology

## 2020-07-18 NOTE — Telephone Encounter (Signed)
LVM 2x for patient to call office to reschedule 10/06/2020 due to Doctor's schedule. Letter was mailed out to current address.

## 2020-08-12 ENCOUNTER — Other Ambulatory Visit: Payer: Self-pay | Admitting: Orthopedic Surgery

## 2020-09-17 ENCOUNTER — Ambulatory Visit (INDEPENDENT_AMBULATORY_CARE_PROVIDER_SITE_OTHER): Payer: BC Managed Care – PPO | Admitting: Gastroenterology

## 2020-09-17 ENCOUNTER — Other Ambulatory Visit: Payer: Self-pay

## 2020-09-17 VITALS — BP 141/92 | HR 78 | Temp 98.1°F | Ht 74.0 in | Wt 222.0 lb

## 2020-09-17 DIAGNOSIS — K2970 Gastritis, unspecified, without bleeding: Secondary | ICD-10-CM | POA: Diagnosis not present

## 2020-09-17 DIAGNOSIS — R194 Change in bowel habit: Secondary | ICD-10-CM | POA: Diagnosis not present

## 2020-09-17 MED ORDER — PEG 3350-KCL-NABCB-NACL-NASULF 236 G PO SOLR: ORAL | 0 refills | Status: DC

## 2020-09-17 MED ORDER — PANTOPRAZOLE SODIUM 40 MG PO TBEC: 40.0000 mg | DELAYED_RELEASE_TABLET | Freq: Every day | ORAL | 1 refills | Status: DC

## 2020-09-17 NOTE — Progress Notes (Signed)
Jonathon Bellows MD, MRCP(U.K) [address]  [place]  [place], [place] [address]  Main: [phone]  Fax: [phone]   Gastroenterology Consultation  Referring Provider:     Lujean Amel, MD Primary Care Physician:  Lujean Amel, MD Primary Gastroenterologist:  Dr. Jonathon Bellows  Reason for Consultation:     GERD        HPI:   Kent Floyd is a 49 y.o. y/o male referred for consultation & management  by Dr. Dorthy Cooler, [place], MD.    He was recently seen and evaluated in the emergency room on 07/11/2020 when he presented with abdominal pain.  ER note mentions that he had nausea for a month but at that point of time denies any pain he underwent a CT scan of the abdomen and pelvis with contrast which demonstrated a 3 cm lesion in the right hepatic lobe consistent with a benign hemangioma.  He was treated for gastroenteritis/gastritis and referred to GI for outpatient follow-up  07/11/2020: CBC: Normal, urine analysis normal, CMP normal except elevated glucose.  Lipase was normal.  He states that he woke up earlier in the day of when he presented to the emergency room with discomfort in the middle of the abdomen burning in sensation nonradiating no clear aggravating or relieving factors which improved after commencing on the PPI that he was given in the emergency room.  No prior history previously.  When he misses a PPI dose he gets symptoms yet again.  He has also noted a change in his bowel habits previously suffered from constipation his whole life of late have been more loose and then again been constipated.  Denies any rectal bleeding.  No family history of colon cancer or polyps.  No prior endoscopy.  Does not smoke.  No weight loss.  He works in the Physicist, medical.  Denies any NSAID use.   Past Medical History:  Diagnosis Date  . Headache     No past surgical history on file.  Prior to Admission medications   Medication Sig Start Date End Date Taking? Authorizing Provider    IBUPROFEN PO Take 200 mg by mouth as needed.    [provider]  loratadine (CLARITIN) 10 MG tablet Take 10 mg by mouth daily.    [provider]  LORazepam (ATIVAN) 0.5 MG tablet Take 1 tablet (0.5 mg total) by mouth 2 (two) times daily. 07/11/20 07/11/21  Earleen Newport, MD  mometasone (NASONEX) 50 MCG/ACT nasal spray Place 2 sprays into the nose daily.    [provider]  montelukast (SINGULAIR) 10 MG tablet Take 10 mg by mouth at bedtime.    [provider]  ondansetron (ZOFRAN) 4 MG tablet Take 1 tablet (4 mg total) by mouth daily as needed for nausea or vomiting. 07/11/20   Earleen Newport, MD  pantoprazole (PROTONIX) 40 MG tablet Take 1 tablet (40 mg total) by mouth daily. 07/11/20 07/11/21  Earleen Newport, MD    Family History  Problem Relation Age of Onset  . Healthy Mother   . Skin cancer Father      Social History   Tobacco Use  . Smoking status: Never Smoker  . Smokeless tobacco: Never Used  Substance Use Topics  . Alcohol use: Yes    Alcohol/week: 0.0 standard drinks    Comment: 2 drinks per month  . Drug use: No    Allergies as of 09/17/2020 - Review Complete 07/11/2020  Allergen Reaction Noted  . Tree extract  Other (See Comments) 04/12/2016    Review of Systems:    All systems reviewed and negative except where noted in HPI.   Physical Exam:  There were no vitals taken for this visit. No LMP for male patient. Psych:  Alert and cooperative. Normal mood and affect. General:   Alert,  Well-developed, well-nourished, pleasant and cooperative in NAD Head:  Normocephalic and atraumatic. Eyes:  Sclera clear, no icterus.   Conjunctiva pink. Ears:  Normal auditory acuity. Lungs:  Respirations even and unlabored.  Clear throughout to auscultation.   No wheezes, crackles, or rhonchi. No acute distress. Heart:  Regular rate and rhythm; no murmurs, clicks, rubs, or gallops. Abdomen:  Normal bowel sounds.  No bruits.   Soft, non-tender and non-distended without masses, hepatosplenomegaly or hernias noted.  No guarding or rebound tenderness.    Neurologic:  Alert and oriented x3;  grossly normal neurologically. Psych:  Alert and cooperative. Normal mood and affect.  Imaging Studies: No results found.  Assessment and Plan:   Kent Floyd is a 49 y.o. y/o male has been referred for an ER visit follow-up when he presented in June 2021 he had some nausea.  CT scan of the abdomen and pelvis and lab work were essentially normal except an elevated glucose.  History suggestive of gastritis that responds to a PPI.  Also noted a change in bowel habits with no other red flag signs.  Plan 1.  Check H. pylori breath test.  If positive treat and if negative will proceed with EGD as well as colonoscopy to evaluate change in bowel habits 2.  Refill of Protonix  I have discussed alternative options, risks & benefits,  which include, but are not limited to, bleeding, infection, perforation,respiratory complication & drug reaction.  The patient agrees with this plan & written consent will be obtained.     Follow up in 8 to 12 weeks telephone visit  Dr Jonathon Bellows MD,MRCP(U.K)

## 2020-09-18 LAB — H. PYLORI BREATH TEST: H pylori Breath Test: NEGATIVE

## 2020-09-19 ENCOUNTER — Encounter: Payer: Self-pay | Admitting: Gastroenterology

## 2020-10-02 ENCOUNTER — Other Ambulatory Visit
Admission: RE | Admit: 2020-10-02 | Discharge: 2020-10-02 | Disposition: A | Discharge status: Home / Self Care | Payer: BC Managed Care – PPO | Source: Ambulatory Visit | Attending: Gastroenterology | Admitting: Gastroenterology

## 2020-10-02 DIAGNOSIS — Z01812 Encounter for preprocedural laboratory examination: Secondary | ICD-10-CM | POA: Diagnosis not present

## 2020-10-02 DIAGNOSIS — Z20822 Contact with and (suspected) exposure to covid-19: Secondary | ICD-10-CM | POA: Insufficient documentation

## 2020-10-02 LAB — SARS CORONAVIRUS 2 (TAT 6-24 HRS): SARS Coronavirus 2: NEGATIVE

## 2020-10-06 ENCOUNTER — Ambulatory Visit: Payer: BC Managed Care – PPO | Admitting: Certified Registered Nurse Anesthetist

## 2020-10-06 ENCOUNTER — Ambulatory Visit: Payer: BC Managed Care – PPO | Admitting: Gastroenterology

## 2020-10-06 ENCOUNTER — Ambulatory Visit
Admission: RE | Admit: 2020-10-06 | Discharge: 2020-10-06 | Disposition: A | Discharge status: Home / Self Care | Payer: BC Managed Care – PPO | Attending: Gastroenterology | Admitting: Gastroenterology

## 2020-10-06 ENCOUNTER — Encounter: Payer: Self-pay | Admitting: Gastroenterology

## 2020-10-06 ENCOUNTER — Encounter
Admission: RE | Disposition: A | Discharge status: Home / Self Care | Payer: Self-pay | Source: Home / Self Care | Attending: Gastroenterology

## 2020-10-06 DIAGNOSIS — K2970 Gastritis, unspecified, without bleeding: Secondary | ICD-10-CM

## 2020-10-06 DIAGNOSIS — R519 Headache, unspecified: Secondary | ICD-10-CM | POA: Insufficient documentation

## 2020-10-06 DIAGNOSIS — R194 Change in bowel habit: Secondary | ICD-10-CM | POA: Diagnosis not present

## 2020-10-06 DIAGNOSIS — Z808 Family history of malignant neoplasm of other organs or systems: Secondary | ICD-10-CM | POA: Diagnosis not present

## 2020-10-06 DIAGNOSIS — Z79899 Other long term (current) drug therapy: Secondary | ICD-10-CM | POA: Diagnosis not present

## 2020-10-06 DIAGNOSIS — Z7951 Long term (current) use of inhaled steroids: Secondary | ICD-10-CM | POA: Diagnosis not present

## 2020-10-06 DIAGNOSIS — Z9109 Other allergy status, other than to drugs and biological substances: Secondary | ICD-10-CM | POA: Insufficient documentation

## 2020-10-06 DIAGNOSIS — D120 Benign neoplasm of cecum: Secondary | ICD-10-CM | POA: Diagnosis not present

## 2020-10-06 DIAGNOSIS — K3189 Other diseases of stomach and duodenum: Secondary | ICD-10-CM | POA: Insufficient documentation

## 2020-10-06 DIAGNOSIS — K635 Polyp of colon: Secondary | ICD-10-CM | POA: Diagnosis not present

## 2020-10-06 DIAGNOSIS — Z791 Long term (current) use of non-steroidal anti-inflammatories (NSAID): Secondary | ICD-10-CM | POA: Insufficient documentation

## 2020-10-06 HISTORY — PX: ESOPHAGOGASTRODUODENOSCOPY (EGD) WITH PROPOFOL: SHX5813

## 2020-10-06 HISTORY — PX: COLONOSCOPY WITH PROPOFOL: SHX5780

## 2020-10-06 SURGERY — COLONOSCOPY WITH PROPOFOL
Anesthesia: General

## 2020-10-06 MED ORDER — SODIUM CHLORIDE 0.9 % IV SOLN
INTRAVENOUS | Status: DC
  Administered 2020-10-06: 1000 mL via INTRAVENOUS

## 2020-10-06 MED ORDER — EPHEDRINE SULFATE 50 MG/ML IJ SOLN
INTRAMUSCULAR | Status: DC | PRN
  Administered 2020-10-06: 5 mg via INTRAVENOUS

## 2020-10-06 MED ORDER — EPHEDRINE 5 MG/ML INJ
INTRAVENOUS | Status: AC
  Filled 2020-10-06: qty 10

## 2020-10-06 MED ORDER — LIDOCAINE HCL (CARDIAC) PF 100 MG/5ML IV SOSY
PREFILLED_SYRINGE | INTRAVENOUS | Status: DC | PRN
  Administered 2020-10-06: 100 mg via INTRAVENOUS

## 2020-10-06 MED ORDER — PROPOFOL 500 MG/50ML IV EMUL
INTRAVENOUS | Status: DC | PRN
  Administered 2020-10-06: 170 ug/kg/min via INTRAVENOUS

## 2020-10-06 MED ORDER — PROPOFOL 10 MG/ML IV BOLUS
INTRAVENOUS | Status: DC | PRN
  Administered 2020-10-06: 80 mg via INTRAVENOUS

## 2020-10-06 NOTE — H&P (Signed)
Jonathon Bellows, MD [address], [place], [place], [place], [address] [address], [place], [place], [place], [address] Phone: [phone]  Fax: [phone]  Primary Care Physician:  Lujean Amel, MD   Pre-Procedure History & Physical: HPI:  Kent Floyd is a 49 y.o. male is here for an endoscopy and colonoscopy    Past Medical History:  Diagnosis Date  . Headache     History reviewed. No pertinent surgical history.  Prior to Admission medications   Medication Sig Start Date End Date Taking? Authorizing Provider  IBUPROFEN PO Take 200 mg by mouth as needed.   Yes [provider]  loratadine (CLARITIN) 10 MG tablet Take 10 mg by mouth daily.   Yes [provider]  pantoprazole (PROTONIX) 40 MG tablet Take 1 tablet (40 mg total) by mouth daily. 09/17/20 03/16/21 Yes Jonathon Bellows, MD  polyethylene glycol (GOLYTELY) 236 g solution Drink 8 oz every 20-30 minutes until entire prep is finished 09/17/20  Yes Jonathon Bellows, MD  LORazepam (ATIVAN) 0.5 MG tablet Take 1 tablet (0.5 mg total) by mouth 2 (two) times daily. Patient not taking: Reported on 09/17/2020 07/11/20 07/11/21  Earleen Newport, MD  mometasone (NASONEX) 50 MCG/ACT nasal spray Place 2 sprays into the nose daily.    [provider]  montelukast (SINGULAIR) 10 MG tablet Take 10 mg by mouth at bedtime.    [provider]  ondansetron (ZOFRAN) 4 MG tablet Take 1 tablet (4 mg total) by mouth daily as needed for nausea or vomiting. 07/11/20   Earleen Newport, MD    Allergies as of 09/17/2020 - Review Complete 09/17/2020  Allergen Reaction Noted  . Tree extract Other (See Comments) 04/12/2016    Family History  Problem Relation Age of Onset  . Healthy Mother   . Skin cancer Father     Social History   Socioeconomic History  . Marital status: Married    Spouse name: Not on file  . Number of children: 2  . Years of education: college  . Highest education level:  Bachelor's degree (e.g., BA, AB, BS)  Occupational History  . Occupation: English as a second language teacher  Tobacco Use  . Smoking status: Never Smoker  . Smokeless tobacco: Never Used  Vaping Use  . Vaping Use: Never used  Substance and Sexual Activity  . Alcohol use: Yes    Alcohol/week: 0.0 standard drinks    Comment: 2 drinks per month  . Drug use: No  . Sexual activity: Not on file  Other Topics Concern  . Not on file  Social History Narrative   Lives at home with his wife and child.   Right-handed.   Caffeine per day: cup of coffee in morning, glass of tea at lunch and dinner   Social Determinants of Health   Financial Resource Strain:   . Difficulty of Paying Living Expenses: Not on file  Food Insecurity:   . Worried About Charity fundraiser in the Last Year: Not on file  . Ran Out of Food in the Last Year: Not on file  Transportation Needs:   . Lack of Transportation (Medical): Not on file  . Lack of Transportation (Non-Medical): Not on file  Physical Activity:   . Days of Exercise per Week: Not on file  . Minutes of Exercise per Session: Not on file  Stress:   . Feeling of Stress : Not on file  Social Connections:   . Frequency of Communication with Friends and Family:  Not on file  . Frequency of Social Gatherings with Friends and Family: Not on file  . Attends Religious Services: Not on file  . Active Member of Clubs or Organizations: Not on file  . Attends Archivist Meetings: Not on file  . Marital Status: Not on file  Intimate Partner Violence:   . Fear of Current or Ex-Partner: Not on file  . Emotionally Abused: Not on file  . Physically Abused: Not on file  . Sexually Abused: Not on file    Review of Systems: See HPI, otherwise negative ROS  Physical Exam: BP (!) 135/96   Pulse 84   Temp (!) 96.6 F (35.9 C)   Resp 16   Ht 6' (1.829 m)   Wt 100.9 kg   SpO2 100%   BMI 30.17 kg/m  General:   Alert,  pleasant and cooperative in NAD Head:  Normocephalic  and atraumatic. Neck:  Supple; no masses or thyromegaly. Lungs:  Clear throughout to auscultation, normal respiratory effort.    Heart:  +S1, +S2, Regular rate and rhythm, No edema. Abdomen:  Soft, nontender and nondistended. Normal bowel sounds, without guarding, and without rebound.   Neurologic:  Alert and  oriented x4;  grossly normal neurologically.  Impression/Plan: Kent Floyd is here for an endoscopy and colonoscopy  to be performed for  evaluation of abdominal pain and change in bowel habits    Risks, benefits, limitations, and alternatives regarding endoscopy have been reviewed with the patient.  Questions have been answered.  All parties agreeable.   Jonathon Bellows, MD  10/06/2020, 8:31 AM

## 2020-10-06 NOTE — Op Note (Signed)
[facility] Gastroenterology Patient Name: Kent Floyd Procedure Date: 10/06/2020 8:58 AM MRN: [id number] Account #: [id number] Date of Birth: [birth year] Admit Type: Outpatient Age: 49 Room: [facility] ENDO ROOM 1 Gender: Male Note Status: Finalized Procedure:             Upper GI endoscopy Indications:           Dyspepsia Providers:             Jonathon Bellows MD, MD Referring MD:          Lujean Amel, MD (Referring MD) Medicines:             Monitored Anesthesia Care Complications:         No immediate complications. Procedure:             Pre-Anesthesia Assessment:                        - Prior to the procedure, a History and Physical was                         performed, and patient medications, allergies and                         sensitivities were reviewed. The patient's tolerance                         of previous anesthesia was reviewed.                        - The risks and benefits of the procedure and the                         sedation options and risks were discussed with the                         patient. All questions were answered and informed                         consent was obtained.                        - ASA Grade Assessment: II - A patient with mild                         systemic disease.                        After obtaining informed consent, the endoscope was                         passed under direct vision. Throughout the procedure,                         the patient's blood pressure, pulse, and oxygen                         saturations were monitored continuously. The Endoscope                         was introduced through the mouth, and advanced to  the                         third part of duodenum. The upper GI endoscopy was                         accomplished with ease. The patient tolerated the                         procedure well. Findings:      The esophagus was normal.      The examined duodenum was normal.       The entire examined stomach was normal. Biopsies were taken with a cold       forceps for histology.      The cardia and gastric fundus were normal on retroflexion. Impression:            - Normal esophagus.                        - Normal examined duodenum.                        - Normal stomach. Biopsied. Recommendation:        - Await pathology results.                        - Perform a colonoscopy today. Procedure Code(s):     --- Professional ---                        [phone], Esophagogastroduodenoscopy, flexible,                         transoral; with biopsy, single or multiple Diagnosis Code(s):     --- Professional ---                        R10.13, Epigastric pain CPT copyright 2019 American Medical Association. All rights reserved. The codes documented in this report are preliminary and upon coder review may  be revised to meet current compliance requirements. Jonathon Bellows, MD Jonathon Bellows MD, MD 10/06/2020 9:17:51 AM This report has been signed electronically. Number of Addenda: 0 Note Initiated On: 10/06/2020 8:58 AM Estimated Blood Loss:  Estimated blood loss: none.      [facility]

## 2020-10-06 NOTE — Transfer of Care (Signed)
Immediate Anesthesia Transfer of Care Note  Patient: Kent Floyd  Procedure(s) Performed: COLONOSCOPY WITH PROPOFOL (N/A ) ESOPHAGOGASTRODUODENOSCOPY (EGD) WITH PROPOFOL (N/A )  Patient Location: PACU  Anesthesia Type:General  Level of Consciousness: drowsy  Airway & Oxygen Therapy: Patient Spontanous Breathing and Patient connected to nasal cannula oxygen  Post-op Assessment: Report given to RN and Post -op Vital signs reviewed and stable  Post vital signs: Reviewed and stable  Last Vitals:  Vitals Value Taken Time  BP 106/71 10/06/20 0942  Temp    Pulse 81 10/06/20 0942  Resp 16 10/06/20 0942  SpO2 97 % 10/06/20 0942  Vitals shown include unvalidated device data.  Last Pain:  Vitals:   10/06/20 0741  PainSc: 0-No pain         Complications: No complications documented.

## 2020-10-06 NOTE — Op Note (Signed)
[facility] Gastroenterology Patient Name: Kent Floyd Procedure Date: 10/06/2020 8:57 AM MRN: [id number] Account #: [id number] Date of Birth: 1971/05/16 Admit Type: Outpatient Age: 49 Room: [facility] ENDO ROOM 1 Gender: Male Note Status: Finalized Procedure:             Colonoscopy Indications:           Change in bowel habits Providers:             Jonathon Bellows MD, MD Medicines:             Monitored Anesthesia Care Complications:         No immediate complications. Procedure:             Pre-Anesthesia Assessment:                        - Prior to the procedure, a History and Physical was                         performed, and patient medications, allergies and                         sensitivities were reviewed. The patient's tolerance                         of previous anesthesia was reviewed.                        - The risks and benefits of the procedure and the                         sedation options and risks were discussed with the                         patient. All questions were answered and informed                         consent was obtained.                        - ASA Grade Assessment: II - A patient with mild                         systemic disease.                        - ASA Grade Assessment: II - A patient with mild                         systemic disease.                        After obtaining informed consent, the colonoscope was                         passed under direct vision. Throughout the procedure,                         the patient's blood pressure, pulse, and oxygen  saturations were monitored continuously. The                         Colonoscope was introduced through the anus and                         advanced to the the cecum, identified by the                         appendiceal orifice. The colonoscopy was performed                         with ease. The patient tolerated the procedure well.                          The quality of the bowel preparation was excellent. Findings:      The perianal and digital rectal examinations were normal.      A 3 mm polyp was found in the cecum. The polyp was sessile. The polyp       was removed with a cold biopsy forceps. Resection and retrieval were       complete.      The exam was otherwise without abnormality on direct and retroflexion       views. Impression:            - One 3 mm polyp in the cecum, removed with a cold                         biopsy forceps. Resected and retrieved.                        - The examination was otherwise normal on direct and                         retroflexion views. Recommendation:        - Discharge patient to home (with escort).                        - Resume previous diet.                        - Continue present medications.                        - Await pathology results.                        - Repeat colonoscopy for surveillance based on                         pathology results. Procedure Code(s):     --- Professional ---                        [phone], Colonoscopy, flexible; with biopsy, single or                         multiple Diagnosis Code(s):     --- Professional ---  K63.5, Polyp of colon                        R19.4, Change in bowel habit CPT copyright 2019 American Medical Association. All rights reserved. The codes documented in this report are preliminary and upon coder review may  be revised to meet current compliance requirements. Jonathon Bellows, MD Jonathon Bellows MD, MD 10/06/2020 9:43:09 AM This report has been signed electronically. Number of Addenda: 0 Note Initiated On: 10/06/2020 8:57 AM Scope Withdrawal Time: 0 hours 13 minutes 3 seconds  Total Procedure Duration: 0 hours 19 minutes 6 seconds  Estimated Blood Loss:  Estimated blood loss: none.      [facility]

## 2020-10-06 NOTE — Anesthesia Preprocedure Evaluation (Signed)
Anesthesia Evaluation  Patient identified by MRN, date of birth, ID band Patient awake    Reviewed: Allergy & Precautions, H&P , NPO status , Patient's Chart, lab work & pertinent test results, reviewed documented beta blocker date and time   Airway Mallampati: II   Neck ROM: full    Dental  (+) Poor Dentition   Pulmonary neg pulmonary ROS,    Pulmonary exam normal        Cardiovascular negative cardio ROS Normal cardiovascular exam Rhythm:regular Rate:Normal     Neuro/Psych  Headaches, negative psych ROS   GI/Hepatic negative GI ROS, Neg liver ROS,   Endo/Other  negative endocrine ROS  Renal/GU negative Renal ROS  negative genitourinary   Musculoskeletal   Abdominal   Peds  Hematology negative hematology ROS (+)   Anesthesia Other Findings Past Medical History: No date: Headache History reviewed. No pertinent surgical history. BMI    Body Mass Index: 30.17 kg/m     Reproductive/Obstetrics negative OB ROS                             Anesthesia Physical Anesthesia Plan  ASA: II  Anesthesia Plan: General   Post-op Pain Management:    Induction:   PONV Risk Score and Plan:   Airway Management Planned:   Additional Equipment:   Intra-op Plan:   Post-operative Plan:   Informed Consent: I have reviewed the patients History and Physical, chart, labs and discussed the procedure including the risks, benefits and alternatives for the proposed anesthesia with the patient or authorized representative who has indicated his/her understanding and acceptance.     Dental Advisory Given  Plan Discussed with: CRNA  Anesthesia Plan Comments:         Anesthesia Quick Evaluation

## 2020-10-07 ENCOUNTER — Encounter: Payer: Self-pay | Admitting: Gastroenterology

## 2020-10-07 LAB — SURGICAL PATHOLOGY

## 2020-10-07 NOTE — Anesthesia Postprocedure Evaluation (Signed)
Anesthesia Post Note  Patient: Kolden Dupee  Procedure(s) Performed: COLONOSCOPY WITH PROPOFOL (N/A ) ESOPHAGOGASTRODUODENOSCOPY (EGD) WITH PROPOFOL (N/A )  Patient location during evaluation: PACU Anesthesia Type: General Level of consciousness: awake and alert Pain management: pain level controlled Vital Signs Assessment: post-procedure vital signs reviewed and stable Respiratory status: spontaneous breathing, nonlabored ventilation and respiratory function stable Cardiovascular status: blood pressure returned to baseline and stable Postop Assessment: no apparent nausea or vomiting Anesthetic complications: no   No complications documented.   Last Vitals:  Vitals:   10/06/20 1002 10/06/20 1012  BP: (!) 134/93 (!) 142/95  Pulse: 68 67  Resp: 11 12  Temp:    SpO2: 100% 100%    Last Pain:  Vitals:   10/06/20 1012  TempSrc:   PainSc: 0-No pain                 Brett Canales Katrenia Alkins

## 2020-10-20 ENCOUNTER — Telehealth: Payer: Self-pay

## 2020-10-20 ENCOUNTER — Encounter: Payer: Self-pay | Admitting: Gastroenterology

## 2020-10-20 NOTE — Telephone Encounter (Signed)
Called pt to inform of results and Dr. Anna's recommendations.  Unable to contact, LVM to return call 

## 2020-10-20 NOTE — Telephone Encounter (Signed)
Biopsies of the stomach were benign.  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  Recommend repeat colonoscopy in 7 years time.

## 2020-10-20 NOTE — Telephone Encounter (Signed)
Pt lvm requesting biopsy results for recent colonoscopy.

## 2020-10-22 NOTE — Telephone Encounter (Signed)
Called pt to inform of results and Dr. Anna's recommendations.  Unable to contact, LVM to return call 

## 2020-10-23 NOTE — Telephone Encounter (Signed)
Pt has been notified of results and Dr. Anna's recommendations. 

## 2020-11-28 ENCOUNTER — Encounter (HOSPITAL_BASED_OUTPATIENT_CLINIC_OR_DEPARTMENT_OTHER): Payer: Self-pay | Admitting: Orthopedic Surgery

## 2020-11-28 ENCOUNTER — Other Ambulatory Visit: Payer: Self-pay

## 2020-12-01 ENCOUNTER — Other Ambulatory Visit (HOSPITAL_COMMUNITY): Payer: BC Managed Care – PPO

## 2020-12-02 ENCOUNTER — Other Ambulatory Visit (HOSPITAL_COMMUNITY)
Admission: RE | Admit: 2020-12-02 | Discharge: 2020-12-02 | Disposition: A | Discharge status: Home / Self Care | Payer: BC Managed Care – PPO | Source: Ambulatory Visit | Attending: Orthopedic Surgery | Admitting: Orthopedic Surgery

## 2020-12-02 DIAGNOSIS — Z20822 Contact with and (suspected) exposure to covid-19: Secondary | ICD-10-CM | POA: Diagnosis not present

## 2020-12-02 DIAGNOSIS — Z01812 Encounter for preprocedural laboratory examination: Secondary | ICD-10-CM | POA: Insufficient documentation

## 2020-12-02 DIAGNOSIS — M720 Palmar fascial fibromatosis [Dupuytren]: Secondary | ICD-10-CM | POA: Diagnosis not present

## 2020-12-02 LAB — SARS CORONAVIRUS 2 (TAT 6-24 HRS): SARS Coronavirus 2: NEGATIVE

## 2020-12-02 NOTE — Progress Notes (Signed)

## 2020-12-04 ENCOUNTER — Encounter (HOSPITAL_BASED_OUTPATIENT_CLINIC_OR_DEPARTMENT_OTHER): Payer: Self-pay | Admitting: Orthopedic Surgery

## 2020-12-04 ENCOUNTER — Ambulatory Visit (HOSPITAL_BASED_OUTPATIENT_CLINIC_OR_DEPARTMENT_OTHER)
Admission: RE | Admit: 2020-12-04 | Discharge: 2020-12-04 | Disposition: A | Discharge status: Home / Self Care | Payer: BC Managed Care – PPO | Attending: Orthopedic Surgery | Admitting: Orthopedic Surgery

## 2020-12-04 ENCOUNTER — Other Ambulatory Visit: Payer: Self-pay

## 2020-12-04 ENCOUNTER — Encounter (HOSPITAL_BASED_OUTPATIENT_CLINIC_OR_DEPARTMENT_OTHER)
Admission: RE | Disposition: A | Discharge status: Home / Self Care | Payer: Self-pay | Source: Home / Self Care | Attending: Orthopedic Surgery

## 2020-12-04 ENCOUNTER — Ambulatory Visit (HOSPITAL_BASED_OUTPATIENT_CLINIC_OR_DEPARTMENT_OTHER): Payer: BC Managed Care – PPO | Admitting: Anesthesiology

## 2020-12-04 DIAGNOSIS — M720 Palmar fascial fibromatosis [Dupuytren]: Secondary | ICD-10-CM | POA: Diagnosis not present

## 2020-12-04 DIAGNOSIS — Z20822 Contact with and (suspected) exposure to covid-19: Secondary | ICD-10-CM | POA: Insufficient documentation

## 2020-12-04 HISTORY — PX: DUPUYTREN CONTRACTURE RELEASE: SHX1478

## 2020-12-04 SURGERY — RELEASE, DUPUYTREN CONTRACTURE
Anesthesia: Monitor Anesthesia Care | Site: Little Finger | Laterality: Left

## 2020-12-04 MED ORDER — MIDAZOLAM HCL 2 MG/2ML IJ SOLN
2.0000 mg | Freq: Once | INTRAMUSCULAR | Status: AC
  Administered 2020-12-04: 11:00:00 2 mg via INTRAVENOUS

## 2020-12-04 MED ORDER — FENTANYL CITRATE (PF) 100 MCG/2ML IJ SOLN
INTRAMUSCULAR | Status: AC
  Filled 2020-12-04: qty 2

## 2020-12-04 MED ORDER — LACTATED RINGERS IV SOLN: INTRAVENOUS | Status: DC

## 2020-12-04 MED ORDER — TRAMADOL HCL 50 MG PO TABS: 50.0000 mg | ORAL_TABLET | Freq: Four times a day (QID) | ORAL | 0 refills | Status: DC | PRN

## 2020-12-04 MED ORDER — PROPOFOL 10 MG/ML IV BOLUS
INTRAVENOUS | Status: DC | PRN
  Administered 2020-12-04: 20 mg via INTRAVENOUS
  Administered 2020-12-04: 30 mg via INTRAVENOUS

## 2020-12-04 MED ORDER — ACETAMINOPHEN 500 MG PO TABS
1000.0000 mg | ORAL_TABLET | Freq: Once | ORAL | Status: AC
  Administered 2020-12-04: 09:00:00 1000 mg via ORAL

## 2020-12-04 MED ORDER — ONDANSETRON HCL 4 MG/2ML IJ SOLN
INTRAMUSCULAR | Status: DC | PRN
  Administered 2020-12-04: 4 mg via INTRAVENOUS

## 2020-12-04 MED ORDER — PROPOFOL 10 MG/ML IV BOLUS
INTRAVENOUS | Status: AC
  Filled 2020-12-04: qty 20

## 2020-12-04 MED ORDER — DEXAMETHASONE SODIUM PHOSPHATE 10 MG/ML IJ SOLN
INTRAMUSCULAR | Status: DC | PRN
  Administered 2020-12-04: 5 mg

## 2020-12-04 MED ORDER — FENTANYL CITRATE (PF) 100 MCG/2ML IJ SOLN: 25.0000 ug | INTRAMUSCULAR | Status: DC | PRN

## 2020-12-04 MED ORDER — ROPIVACAINE HCL 5 MG/ML IJ SOLN
INTRAMUSCULAR | Status: DC | PRN
Start: 2020-12-04 — End: 2020-12-04
  Administered 2020-12-04: 30 mL via PERINEURAL

## 2020-12-04 MED ORDER — PROPOFOL 500 MG/50ML IV EMUL
INTRAVENOUS | Status: DC | PRN
  Administered 2020-12-04: 50 ug/kg/min via INTRAVENOUS

## 2020-12-04 MED ORDER — CEFAZOLIN SODIUM-DEXTROSE 2-4 GM/100ML-% IV SOLN
2.0000 g | INTRAVENOUS | Status: AC
  Administered 2020-12-04: 10:00:00 2 g via INTRAVENOUS

## 2020-12-04 MED ORDER — THROMBIN 5000 UNITS EX SOLR
CUTANEOUS | Status: AC
  Filled 2020-12-04: qty 5000

## 2020-12-04 MED ORDER — FENTANYL CITRATE (PF) 100 MCG/2ML IJ SOLN
100.0000 ug | Freq: Once | INTRAMUSCULAR | Status: AC
  Administered 2020-12-04: 11:00:00 100 ug via INTRAVENOUS

## 2020-12-04 MED ORDER — CEFAZOLIN SODIUM-DEXTROSE 2-4 GM/100ML-% IV SOLN
INTRAVENOUS | Status: AC
  Filled 2020-12-04: qty 100

## 2020-12-04 MED ORDER — ACETAMINOPHEN 500 MG PO TABS
ORAL_TABLET | ORAL | Status: AC
  Filled 2020-12-04: qty 2

## 2020-12-04 MED ORDER — MIDAZOLAM HCL 2 MG/2ML IJ SOLN
INTRAMUSCULAR | Status: AC
  Filled 2020-12-04: qty 2

## 2020-12-04 MED ORDER — THROMBIN 5000 UNITS EX SOLR
CUTANEOUS | Status: DC | PRN
  Administered 2020-12-04: 5000 [IU] via TOPICAL

## 2020-12-04 SURGICAL SUPPLY — 49 items
APL PRP STRL LF DISP 70% ISPRP (MISCELLANEOUS) ×1
BLADE MINI RND TIP GREEN BEAV (BLADE) IMPLANT
BLADE SURG 15 STRL LF DISP TIS (BLADE) ×1 IMPLANT
BLADE SURG 15 STRL SS (BLADE) ×3
BNDG CMPR 9X4 STRL LF SNTH (GAUZE/BANDAGES/DRESSINGS) ×2
BNDG COHESIVE 3X5 TAN STRL LF (GAUZE/BANDAGES/DRESSINGS) ×3 IMPLANT
BNDG ESMARK 4X9 LF (GAUZE/BANDAGES/DRESSINGS) ×6 IMPLANT
BNDG GAUZE ELAST 4 BULKY (GAUZE/BANDAGES/DRESSINGS) ×3 IMPLANT
CHLORAPREP W/TINT 26 (MISCELLANEOUS) ×3 IMPLANT
CORD BIPOLAR FORCEPS 12FT (ELECTRODE) ×3 IMPLANT
COVER BACK TABLE 60X90IN (DRAPES) ×3 IMPLANT
COVER MAYO STAND STRL (DRAPES) ×3 IMPLANT
COVER WAND RF STERILE (DRAPES) IMPLANT
CUFF TOURN SGL QUICK 18X4 (TOURNIQUET CUFF) ×3 IMPLANT
DECANTER SPIKE VIAL GLASS SM (MISCELLANEOUS) IMPLANT
DRAPE EXTREMITY T 121X128X90 (DISPOSABLE) ×3 IMPLANT
DRAPE SURG 17X23 STRL (DRAPES) ×3 IMPLANT
GAUZE SPONGE 4X4 12PLY STRL (GAUZE/BANDAGES/DRESSINGS) ×3 IMPLANT
GAUZE XEROFORM 1X8 LF (GAUZE/BANDAGES/DRESSINGS) ×3 IMPLANT
GLOVE BIO SURGEON STRL SZ7.5 (GLOVE) ×3 IMPLANT
GLOVE BIOGEL PI IND STRL 8 (GLOVE) ×1 IMPLANT
GLOVE BIOGEL PI IND STRL 8.5 (GLOVE) ×1 IMPLANT
GLOVE BIOGEL PI INDICATOR 8 (GLOVE) ×2
GLOVE BIOGEL PI INDICATOR 8.5 (GLOVE) ×2
GLOVE SURG ORTHO 8.0 STRL STRW (GLOVE) ×3 IMPLANT
GLOVE SURG SYN 7.0 (GLOVE) ×3 IMPLANT
GLOVE SURG UNDER POLY LF SZ7 (GLOVE) ×6 IMPLANT
GOWN STRL REUS W/ TWL LRG LVL3 (GOWN DISPOSABLE) ×1 IMPLANT
GOWN STRL REUS W/TWL LRG LVL3 (GOWN DISPOSABLE) ×3
GOWN STRL REUS W/TWL XL LVL3 (GOWN DISPOSABLE) ×6 IMPLANT
LOOP VESSEL MAXI BLUE (MISCELLANEOUS) ×3 IMPLANT
NS IRRIG 1000ML POUR BTL (IV SOLUTION) ×3 IMPLANT
PACK BASIN DAY SURGERY FS (CUSTOM PROCEDURE TRAY) ×3 IMPLANT
PAD CAST 3X4 CTTN HI CHSV (CAST SUPPLIES) ×1 IMPLANT
PADDING CAST ABS 3INX4YD NS (CAST SUPPLIES)
PADDING CAST ABS 4INX4YD NS (CAST SUPPLIES)
PADDING CAST ABS COTTON 3X4 (CAST SUPPLIES) IMPLANT
PADDING CAST ABS COTTON 4X4 ST (CAST SUPPLIES) IMPLANT
PADDING CAST COTTON 3X4 STRL (CAST SUPPLIES) ×3
SLEEVE SCD COMPRESS KNEE MED (MISCELLANEOUS) IMPLANT
SPLINT PLASTER CAST XFAST 3X15 (CAST SUPPLIES) ×10 IMPLANT
SPLINT PLASTER XTRA FASTSET 3X (CAST SUPPLIES) ×20
STOCKINETTE 4X48 STRL (DRAPES) ×3 IMPLANT
SUT ETHILON 4 0 PS 2 18 (SUTURE) ×6 IMPLANT
SUT SILK 2 0 PERMA HAND 18 BK (SUTURE) ×6 IMPLANT
SYR BULB EAR ULCER 3OZ GRN STR (SYRINGE) ×3 IMPLANT
SYR CONTROL 10ML LL (SYRINGE) ×3 IMPLANT
TOWEL GREEN STERILE FF (TOWEL DISPOSABLE) ×6 IMPLANT
UNDERPAD 30X36 HEAVY ABSORB (UNDERPADS AND DIAPERS) ×3 IMPLANT

## 2020-12-04 NOTE — Progress Notes (Signed)
Assisted Dr. Lanetta Inch with left, ultrasound guided, supraclavicular block. Side rails up, monitors on throughout procedure. See vital signs in flow sheet. Tolerated Procedure well.

## 2020-12-04 NOTE — Anesthesia Preprocedure Evaluation (Signed)
Anesthesia Evaluation  Patient identified by MRN, date of birth, ID band Patient awake    Reviewed: Allergy & Precautions, NPO status , Patient's Chart, lab work & pertinent test results  Airway Mallampati: II  TM Distance: >3 FB Neck ROM: Full    Dental no notable dental hx.    Pulmonary asthma ,    Pulmonary exam normal breath sounds clear to auscultation       Cardiovascular negative cardio ROS Normal cardiovascular exam Rhythm:Regular Rate:Normal     Neuro/Psych  Headaches, negative psych ROS   GI/Hepatic Neg liver ROS, GERD  ,  Endo/Other  negative endocrine ROS  Renal/GU negative Renal ROS  negative genitourinary   Musculoskeletal negative musculoskeletal ROS (+)   Abdominal   Peds  Hematology negative hematology ROS (+)   Anesthesia Other Findings   Reproductive/Obstetrics                             Anesthesia Physical Anesthesia Plan  ASA: III  Anesthesia Plan: MAC and Regional   Post-op Pain Management:  Regional for Post-op pain   Induction: Intravenous  PONV Risk Score and Plan: 1 and Propofol infusion, Treatment may vary due to age or medical condition, Midazolam and Ondansetron  Airway Management Planned: Natural Airway  Additional Equipment:   Intra-op Plan:   Post-operative Plan:   Informed Consent: I have reviewed the patients History and Physical, chart, labs and discussed the procedure including the risks, benefits and alternatives for the proposed anesthesia with the patient or authorized representative who has indicated his/her understanding and acceptance.     Dental advisory given  Plan Discussed with: CRNA  Anesthesia Plan Comments:         Anesthesia Quick Evaluation

## 2020-12-04 NOTE — Discharge Instructions (Addendum)
Next dose of Tylenol can be given after 3:08 PM.    Post Anesthesia Home Care Instructions  Activity: Get plenty of rest for the remainder of the day. A responsible individual must stay with you for 24 hours following the procedure.  For the next 24 hours, DO NOT: -Drive a car -Paediatric nurse -Drink alcoholic beverages -Take any medication unless instructed by your physician -Make any legal decisions or sign important papers.  Meals: Start with liquid foods such as gelatin or soup. Progress to regular foods as tolerated. Avoid greasy, spicy, heavy foods. If nausea and/or vomiting occur, drink only clear liquids until the nausea and/or vomiting subsides. Call your physician if vomiting continues.  Special Instructions/Symptoms: Your throat may feel dry or sore from the anesthesia or the breathing tube placed in your throat during surgery. If this causes discomfort, gargle with warm salt water. The discomfort should disappear within 24 hours.  If you had a scopolamine patch placed behind your ear for the management of post- operative nausea and/or vomiting:  1. The medication in the patch is effective for 72 hours, after which it should be removed.  Wrap patch in a tissue and discard in the trash. Wash hands thoroughly with soap and water. 2. You may remove the patch earlier than 72 hours if you experience unpleasant side effects which may include dry mouth, dizziness or visual disturbances. 3. Avoid touching the patch. Wash your hands with soap and water after contact with the patch. [facility] Instructions Hand Surgery  Wound Care: Keep your hand elevated above the level of your heart.  Do not allow it to dangle by your side.  Keep the dressing dry and do not remove it unless your doctor advises you to do so.  He will usually change it at the time of your post-op visit.  Moving your fingers is advised to stimulate circulation but will depend on the site of your surgery.  If you have  a splint applied, your doctor will advise you regarding movement.  Activity: Do not drive or operate machinery today.  Rest today and then you may return to your normal activity and work as indicated by your physician.  Diet:  Drink liquids today or eat a light diet.  You may resume a regular diet tomorrow.    General expectations: Pain for two to three days. Fingers may become slightly swollen.  Call your doctor if any of the following occur: Severe pain not relieved by pain medication. Elevated temperature. Dressing soaked with blood. Inability to move fingers. White or bluish color to fingers.     Post Anesthesia Home Care Instructions  Activity: Get plenty of rest for the remainder of the day. A responsible individual must stay with you for 24 hours following the procedure.  For the next 24 hours, DO NOT: -Drive a car -Paediatric nurse -Drink alcoholic beverages -Take any medication unless instructed by your physician -Make any legal decisions or sign important papers.  Meals: Start with liquid foods such as gelatin or soup. Progress to regular foods as tolerated. Avoid greasy, spicy, heavy foods. If nausea and/or vomiting occur, drink only clear liquids until the nausea and/or vomiting subsides. Call your physician if vomiting continues.  Special Instructions/Symptoms: Your throat may feel dry or sore from the anesthesia or the breathing tube placed in your throat during surgery. If this causes discomfort, gargle with warm salt water. The discomfort should disappear within 24 hours.  If you had a scopolamine patch placed behind  your ear for the management of post- operative nausea and/or vomiting:  1. The medication in the patch is effective for 72 hours, after which it should be removed.  Wrap patch in a tissue and discard in the trash. Wash hands thoroughly with soap and water. 2. You may remove the patch earlier than 72 hours if you experience unpleasant side effects  which may include dry mouth, dizziness or visual disturbances. 3. Avoid touching the patch. Wash your hands with soap and water after contact with the patch.     Regional Anesthesia Blocks  1. Numbness or the inability to move the "blocked" extremity may last from 3-48 hours after placement. The length of time depends on the medication injected and your individual response to the medication. If the numbness is not going away after 48 hours, call your surgeon.  2. The extremity that is blocked will need to be protected until the numbness is gone and the  Strength has returned. Because you cannot feel it, you will need to take extra care to avoid injury. Because it may be weak, you may have difficulty moving it or using it. You may not know what position it is in without looking at it while the block is in effect.  3. For blocks in the legs and feet, returning to weight bearing and walking needs to be done carefully. You will need to wait until the numbness is entirely gone and the strength has returned. You should be able to move your leg and foot normally before you try and bear weight or walk. You will need someone to be with you when you first try to ensure you do not fall and possibly risk injury.  4. Bruising and tenderness at the needle site are common side effects and will resolve in a few days.  5. Persistent numbness or new problems with movement should be communicated to the surgeon or the [facility] [phone] [place] [phone]).

## 2020-12-04 NOTE — H&P (Signed)
Kent Floyd is an 49 y.o. male.   Chief Complaint: contracture left sma ll finger HPI: Kent Floyd is a 49 year old right-hand-dominant male comes in with a complaint of the inability to straighten his small finger left hand. He states this been going on for at least 10 years. He recalls no history of injury. He currently complains of no pain or discomfort. He saw someone in the ER [place] area who talked him about treating this at that time he did not have anything done. He is of [place] descent. He is parents had no similar problems he has siblings with no problems. He has nodules on his feet he does not have them on his penis. There is no curvature. He has not had any treatment. He has no history of diabetes thyroid problems arthritis gout. Family history is negative for each of these also.     Past Medical History:  Diagnosis Date  . Headache     Past Surgical History:  Procedure Laterality Date  . COLONOSCOPY WITH PROPOFOL N/A 10/06/2020   Procedure: COLONOSCOPY WITH PROPOFOL;  Surgeon: Jonathon Bellows, MD;  Location: [facility] ENDOSCOPY;  Service: Gastroenterology;  Laterality: N/A;  . ESOPHAGOGASTRODUODENOSCOPY (EGD) WITH PROPOFOL N/A 10/06/2020   Procedure: ESOPHAGOGASTRODUODENOSCOPY (EGD) WITH PROPOFOL;  Surgeon: Jonathon Bellows, MD;  Location: [facility] ENDOSCOPY;  Service: Gastroenterology;  Laterality: N/A;    Family History  Problem Relation Age of Onset  . Healthy Mother   . Skin cancer Father    Social History:  reports that he has never smoked. He has never used smokeless tobacco. He reports current alcohol use. He reports that he does not use drugs.  Allergies:  Allergies  Allergen Reactions  . Tree Extract Other (See Comments)    Throat itching    No medications prior to admission.    Results for orders placed or performed during the hospital encounter of 12/02/20 (from the past 48 hour(s))  SARS CORONAVIRUS 2 (TAT 6-24 HRS) Nasopharyngeal Nasopharyngeal Swab      Status: None   Collection Time: 12/02/20  1:03 PM   Specimen: Nasopharyngeal Swab  Result Value Ref Range   SARS Coronavirus 2 NEGATIVE NEGATIVE    Comment: (NOTE) SARS-CoV-2 target nucleic acids are NOT DETECTED.  The SARS-CoV-2 RNA is generally detectable in upper and lower respiratory specimens during the acute phase of infection. Negative results do not preclude SARS-CoV-2 infection, do not rule out co-infections with other pathogens, and should not be used as the sole basis for treatment or other patient management decisions. Negative results must be combined with clinical observations, patient history, and epidemiological information. The expected result is Negative.  Fact Sheet for Patients: SugarRoll.be  Fact Sheet for Healthcare Providers: https://www.woods-mathews.com/  This test is not yet approved or cleared by the [place] FDA and  has been authorized for detection and/or diagnosis of SARS-CoV-2 by FDA under an Emergency Use Authorization (EUA). This EUA will remain  in effect (meaning this test can be used) for the duration of the COVID-19 declaration under Se ction 564(b)(1) of the Act, 21 U.S.C. section 360bbb-3(b)(1), unless the authorization is terminated or revoked sooner.  Performed at [facility], [place] [address] [place], [place] [address]     No results found.   Pertinent items are noted in HPI.  Height 6\' 2"  (1.88 m), weight 97.5 kg.  General appearance: alert, cooperative and appears stated age Head: Normocephalic, without obvious abnormality Neck: no JVD Resp: clear to auscultation bilaterally Cardio: regular rate and  rhythm, S1, S2 normal, no murmur, click, rub or gallop GI: soft, non-tender; bowel sounds normal; no masses,  no organomegaly Extremities: contracture left small finger Pulses: 2+ and symmetric Skin: Skin color, texture, turgor normal. No rashes or lesions Neurologic: Grossly  normal Incision/Wound: na  Assessment/Plan 1. Contracture of palmar fascia    Plan: We have again discussed risk and complications of the surgery. He is scheduled for fasciectomy Dupuytren's contracture left small finger he is aware that there is no guarantee to the surgery the possibility of infection recurrence injury to arteries nerves tendons complete relief symptoms dystrophy possibility of stiffness possibility of recurrence and possibility of loss of the finger. He is scheduled for total fasciectomy small finger palmar fascia left hand as an outpatient under regional anesthesia     Daryll Brod 12/04/2020, 6:08 AM

## 2020-12-04 NOTE — Anesthesia Procedure Notes (Signed)
Anesthesia Regional Block: Supraclavicular block   Pre-Anesthetic Checklist: ,, timeout performed, Correct Patient, Correct Site, Correct Laterality, Correct Procedure, Correct Position, site marked, Risks and benefits discussed,  Surgical consent,  Pre-op evaluation,  At surgeon's request and post-op pain management  Laterality: Left  Prep: Maximum Sterile Barrier Precautions used, chloraprep       Needles:  Injection technique: Single-shot  Needle Type: Echogenic Stimulator Needle     Needle Length: 9cm  Needle Gauge: 22     Additional Needles:   Procedures:,,,, ultrasound used (permanent image in chart),,,,  Narrative:  Start time: 12/04/2020 9:41 AM End time: 12/04/2020 9:51 AM Injection made incrementally with aspirations every 5 mL.  Performed by: Personally  Anesthesiologist: Freddrick March, MD  Additional Notes: Monitors applied. No increased pain on injection. No increased resistance to injection. Injection made in 5cc increments. Good needle visualization. Patient tolerated procedure well.

## 2020-12-04 NOTE — Anesthesia Postprocedure Evaluation (Signed)
Anesthesia Post Note  Patient: Kent Floyd  Procedure(s) Performed: FASCIECTOMY LEFT SMALL FINGER (Left Little Finger)     Patient location during evaluation: PACU Anesthesia Type: Regional and MAC Level of consciousness: awake and alert Pain management: pain level controlled Vital Signs Assessment: post-procedure vital signs reviewed and stable Respiratory status: spontaneous breathing, nonlabored ventilation, respiratory function stable and patient connected to nasal cannula oxygen Cardiovascular status: stable and blood pressure returned to baseline Postop Assessment: no apparent nausea or vomiting Anesthetic complications: no   No complications documented.  Last Vitals:  Vitals:   12/04/20 1304 12/04/20 1346  BP: 129/90 110/72  Pulse: 68 72  Resp: 16 20  Temp:  (!) 36.3 C  SpO2: 97% 100%    Last Pain:  Vitals:   12/04/20 1346  TempSrc: Oral  PainSc:                  [name] L Berkley Cronkright

## 2020-12-04 NOTE — Op Note (Signed)
NAME: Kent Floyd MEDICAL RECORD NO: [id number] DATE OF BIRTH: December 16, 1971 FACILITY: [facility] LOCATION: [facility] PHYSICIAN: Wynonia Sours, MD   OPERATIVE REPORT   DATE OF PROCEDURE: 12/04/20    PREOPERATIVE DIAGNOSIS:   Dupuytren's contracture left small finger   POSTOPERATIVE DIAGNOSIS:   Same   PROCEDURE:   Palmar fasciectomy with V-Y advancement left small   SURGEON: Daryll Brod, M.D.   ASSISTANT: Leanora Cover, MD   ANESTHESIA:  Regional with sedation   INTRAVENOUS FLUIDS:  Per anesthesia flow sheet.   ESTIMATED BLOOD LOSS:  Minimal.   COMPLICATIONS:  None.   SPECIMENS:   Palmar fascia   TOURNIQUET TIME:    Total Tourniquet Time Documented: Upper Arm (Left) - 131 minutes Total: Upper Arm (Left) - 131 minutes    DISPOSITION:  Stable to PACU.   INDICATIONS: Patient is a 49 year old male with a history of a significant cord to the left small finger with extension from the middle phalanx to the palm and also along the abductor musculature to mid palm ulnarly.  He is desirous of having this excised due to the lumps in the palm of his hand.  He has elected to undergo fasciectomy.  Preperi-and postoperative course been discussed along with risks and complications he is aware that there is no guarantee to the surgery the possibility of infection recurrence injury to arteries nerves tendons incomplete relief symptoms dystrophy.  The potential for loss of the finger and the significant potential for recurrence.  In the preoperative area the patient is seen extremity marked by both patient and surgeon antibiotic given the supraclavicular block was carried out without difficulty under the direction the anesthesia department  OPERATIVE COURSE: Patient is brought to the operating room placed in the supine position prepped and draped with ChloraPrep.  3-minute dry time was allowed timeout taken to confirm patient procedure for fasciectomy left hand.  The limb was  exsanguinated with an Esmarch bandage turn placed on the arm was inflated to 250 mmHg a volar Bruner incision was used this was begun on the area over the distal palm extended distally in a zigzag manner a significant cord was identified along the hypothenar musculature on the most ulnar border a second cord was palpable going straight as a typical central cord.  Flaps were elevated.  Significant involvement of the skin was present on each of the cords.  The incision was extended proximally to allow visualization of the cord proximally.  The abductor digiti quinti portion was identified.  A large central cord was immediately encountered along with a second cord forming a proximal Y going down along the hyperthenar musculature.  With blunt sharp dissection this was elevated from the skin.  This was done primarily with blunt dissection.  The cord was identified proximally.  A neurovascular bundle which appeared to be going to the radial side of his finger was identified.  Ulnar digital nerve was not identified both to be lying beneath the 2 cords.  The dissection was carried more proximally and at the of the ulnar neurovascular bundle could not be identified.  The dissection was carried all the way back to the egress of the nerves from Guyon's canal.  This again traced distally and found to be quite radial ulnar digital nerve was not easily identified.  The dissection was then carried distally in an attempt to find the neurovascular bundle on the distal portion of the incision this was carried all the way out to the middle  phalanx the ulnar digital vascular bundle was finally identified tracing around the insertion of the cord to the middle phalanx.  The dissection was carried proximally and found to have that nerve and vessel cross underneath the cord and go to the radial side dissection was then carried more proximally until the common digital artery and nerve to the middle ring finger was identified proximally this  was traced distally and found to be separated from the nerve which had crossed under the cord over to the radial side of the digit.  This turned out to be the ulnar neurovascular bundle.  The dissection was then carried proximally believing it from the distal portion of the flexor retinaculum and then from the fascia of the abductor musculature ulnarly.  This allowed the white portion of the cord to be brought distally this was then released from the abductor digit the tendon and followed distally to the middle phalanx where it was transected allowing complete removal of the cord which was sent to pathology.  Both neurovascular bundles were traced and found to be intact over their entire course of the ulnar digital neurovascular bundle being followed proximally and found to be extremely radial on the palm portion of the incision.  The wound was copious irrigated with saline.  The was these were converted to wise.  Thrombin was then placed as an irrigant into the wound a doubled over vessel loop was then placed on the small finger and the flaps were then closed as a VY advancement with interrupted 5-0 nylon sutures.  A sterile compressive dressing was applied.  The tourniquet was deflated.  All fingers immediately pink.  A dorsal splint was applied.  The dressing completed and the patient was taken to the recovery room for observation in satisfactory condition.  He will be discharged home to return to the hand center of [facility] in 1 week on Tylenol ibuprofen for pain with Ultram for breakthrough.  Tourniquet time was 2 hours and 10 minutes.   Daryll Brod, MD Electronically signed, 12/04/20

## 2020-12-04 NOTE — Op Note (Signed)
I assisted Surgeon(s) and Role:    * Daryll Brod, MD - Primary    Leanora Cover, MD - Assisting on the Procedure(s): FASCIECTOMY LEFT SMALL FINGER on 12/04/2020.  I provided assistance on this case as follows: retraction soft tissues, dissection of neurovascular structures, closure of wound.  Electronically signed by: Leanora Cover, MD Date: 12/04/2020 Time: 3:47 PM

## 2020-12-04 NOTE — Transfer of Care (Signed)
Immediate Anesthesia Transfer of Care Note  Patient: Kent Floyd  Procedure(s) Performed: FASCIECTOMY LEFT SMALL FINGER (Left Little Finger)  Patient Location: PACU  Anesthesia Type:MAC combined with regional for post-op pain  Level of Consciousness: awake, alert  and oriented  Airway & Oxygen Therapy: Patient Spontanous Breathing and Patient connected to face mask oxygen  Post-op Assessment: Report given to RN and Post -op Vital signs reviewed and stable  Post vital signs: Reviewed and stable  Last Vitals:  Vitals Value Taken Time  BP    Temp    Pulse    Resp    SpO2      Last Pain:  Vitals:   12/04/20 0907  TempSrc: Oral  PainSc: 0-No pain         Complications: No complications documented.

## 2020-12-05 ENCOUNTER — Encounter (HOSPITAL_BASED_OUTPATIENT_CLINIC_OR_DEPARTMENT_OTHER): Payer: Self-pay | Admitting: Orthopedic Surgery

## 2020-12-05 LAB — SURGICAL PATHOLOGY

## 2021-02-18 NOTE — PMR Pre-admission (Shared)
PMR Admission Coordinator Pre-Admission Assessment  Patient: Kent Floyd is an 50 y.o., male MRN: [id number] DOB: 06/19/1975 Height: $RemoveBeforeDE'6\' 2"'[id number]$  (188 cm) Weight: 70.1 kg              Insurance Information HMO:     PPO:      PCP:      IPA:      80/20:      OTHER:  PRIMARY: Uninsured-medicaid potential       SECONDARY: none   Financial Counselor: none       Phone#:   The Therapist, art Information Summary" for patients in Inpatient Rehabilitation Facilities with attached "Privacy Act [place] Records" was provided and verbally reviewed with: Patient  Emergency Contact Information Contact Information    Name Relation Home Work Mobile   [place] Mother [place], [place] Brother   [phone]     Current Medical History  Patient Admitting Diagnosis: ICH History of Present Illness: Kent Floyd is a 50 y.o. right-handed male with history of alcohol and tobacco use.  According to mother, patient reportedly drinking 3 to 4 pints of hard liquor per day. Per mom, patient lives with girlfriend and 77-year-old daughter.  Independent prior to admission.  Two-level home with bed and bath main level.  He also has a mother with good support.  Presented 02/12/2021 with complaints of headache and lethargy.  Patient noted multiple episodes of vomiting.  Admission chemistries unremarkable except hemoglobin 12.7, AST 129, ALT 83, alcohol 242 ammonia 59, urine drug screen positive benzos.  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  No midline shift or ventriculomegaly.  CT angiogram of head and neck no large or medium vessel occlusion or correctable proximal stenosis.  Echocardiogram with ejection fraction of 55 to 60% no wall motion abnormalities.  Patient underwent left frontal IVC placement 02/13/2021 per Dr. Vertell Limber.  MRI follow-up showed right basal ganglia thalamic intraparenchymal hemorrhage decompressing  into the ventricular system with large amount of blood within the supratentorial and infratentorial ventricular system.  Maintained on Cleviprex for blood pressure control.  Patient did require short-term intubation.  Therapy evaluations completed due to patient's decrease in functional mobility recommendations for physical medicine rehab consult.  Complete NIHSS TOTAL: 4 Glasgow Coma Scale Score: 14  Past Medical History  Past Medical History:  Diagnosis Date  . Epilepsy ([place])     Family History  family history includes Diabetes in his father and mother.  Prior Rehab/Hospitalizations:  Has the patient had prior rehab or hospitalizations prior to admission? Yes  Has the patient had major surgery during 100 days prior to admission? Yes  Current Medications   Current Facility-Administered Medications:  .  Place/Maintain arterial line, , , Until Discontinued **AND** 0.9 %  sodium chloride infusion, , Intra-arterial, PRN, Corey Harold, NP .  acetaminophen (TYLENOL) tablet 650 mg, 650 mg, Oral, Q4H PRN, 650 mg at 02/15/21 2310 **OR** acetaminophen (TYLENOL) 160 MG/5ML solution 650 mg, 650 mg, Per Tube, Q4H PRN, 650 mg at 02/14/21 1113 **OR** acetaminophen (TYLENOL) suppository 650 mg, 650 mg, Rectal, Q4H PRN, Bhagat, Srishti L, MD .  amLODipine (NORVASC) tablet 10 mg, 10 mg, Oral, Daily, Rosalin Hawking, MD, 10 mg at 02/18/21 0926 .  bethanechol (URECHOLINE) tablet 10 mg, 10 mg, Oral, TID, Agarwala, Ravi, MD, 10 mg at 02/18/21 0926 .  carvedilol (COREG) tablet 12.5 mg, 12.5 mg, Oral, BID, Rosalin Hawking, MD, 12.5 mg at 02/18/21 0931 .  chlorhexidine gluconate (MEDLINE KIT) (PERIDEX) 0.12 % solution 15 mL, 15 mL, Mouth Rinse, BID, Ogan, Okoronkwo U, MD, 15 mL at 02/18/21 0820 .  Chlorhexidine Gluconate Cloth 2 % PADS 6 each, 6 each, Topical, Daily, Rosalin Hawking, MD, 6 each at 02/18/21 1324 .  feeding supplement (ENSURE ENLIVE / ENSURE PLUS) liquid 237 mL, 237 mL, Oral, TID BM, Garvin Fila, MD,  237 mL at 02/18/21 1207 .  folic acid (FOLVITE) tablet 1 mg, 1 mg, Oral, Daily, Rosalin Hawking, MD, 1 mg at 02/18/21 0926 .  heparin injection 5,000 Units, 5,000 Units, Subcutaneous, Q8H, Agarwala, Ravi, MD, 5,000 Units at 02/18/21 1328 .  hydrochlorothiazide (MICROZIDE) capsule 12.5 mg, 12.5 mg, Oral, Daily, Agarwala, Ravi, MD, 12.5 mg at 02/18/21 0926 .  labetalol (NORMODYNE) injection 5-20 mg, 5-20 mg, Intravenous, Q10 min PRN, Rosalin Hawking, MD, 20 mg at 02/18/21 0404 .  levETIRAcetam (KEPPRA) IVPB 1000 mg/100 mL premix, 1,000 mg, Intravenous, Q12H, Agarwala, Ravi, MD .  LORazepam (ATIVAN) 2 MG/ML injection, , , ,  .  multivitamin with minerals tablet 1 tablet, 1 tablet, Oral, Daily, Rosalin Hawking, MD, 1 tablet at 02/18/21 0926 .  ondansetron (ZOFRAN) injection 4 mg, 4 mg, Intravenous, Q4H PRN, Gwinda Maine, MD .  senna-docusate (Senokot-S) tablet 1 tablet, 1 tablet, Oral, BID, Rosalin Hawking, MD, 1 tablet at 02/18/21 [phone] .  vancomycin (VANCOREADY) IVPB 1250 mg/250 mL, 1,250 mg, Intravenous, Q12H, Tyrone Apple, RPH, Stopped at 02/18/21 4742  Patients Current Diet:  Diet Order            Diet full liquid Room service appropriate? Yes; Fluid consistency: Thin  Diet effective now                 Precautions / Restrictions Precautions Precautions: Fall Precaution Comments: IVC needs to be clamped for mobiltiy Restrictions Weight Bearing Restrictions: No   Has the patient had 2 or more falls or a fall with injury in the past year?Yes  Prior Activity Level Community (5-7x/wk): drives, picks brother up from work daily  Prior Functional Level Prior Function Level of Independence: Independent Comments: independent, driving  Self Care: Did the patient need help bathing, dressing, using the toilet or eating?  Independent  Indoor Mobility: Did the patient need assistance with walking from room to room (with or without device)? Independent  Stairs: Did the patient need assistance with  internal or external stairs (with or without device)? Independent  Functional Cognition: Did the patient need help planning regular tasks such as shopping or remembering to take medications? Independent  Home Assistive Devices / Equipment Home Equipment: None  Prior Device Use: Indicate devices/aids used by the patient prior to current illness, exacerbation or injury? None of the above  Current Functional Level Cognition  Overall Cognitive Status: Impaired/Different from baseline Current Attention Level: Focused (with moments of sustained) Orientation Level: Oriented to person,Disoriented to time,Disoriented to situation,Disoriented to place Following Commands: Follows one step commands with increased time,Follows one step commands consistently Safety/Judgement: Decreased awareness of safety,Decreased awareness of deficits General Comments: Eyes remained closed for most of session but able to open them to command. Answering questions appropriately with repetition. Oriented x4. Slow processing. Multimodal cuing for sequencing and mobility. Attention: Focused Focused Attention: Impaired Focused Attention Impairment: Verbal basic,Functional basic Problem Solving: Impaired Problem Solving Impairment: Verbal basic,Functional basic Executive Function: Initiating Initiating: Impaired Initiating Impairment: Verbal basic,Functional basic    Extremity Assessment (includes Sensation/Coordination)  Upper Extremity Assessment: Generalized weakness LUE Deficits / Details:  requires increased cues and time to use, but eventually able to generate good force production. shoulder AROM limited to ~60 deg flexion, full PROM LUE Sensation: WNL (pt states no difference in sensation) LUE Coordination: decreased fine motor,decreased gross motor  Lower Extremity Assessment: Generalized weakness,LLE deficits/detail LLE Deficits / Details: increased time and cues to generate force production, but eventually  able to generate and maintain. Pt denies difference in sensation LLE Coordination: decreased fine motor,decreased gross motor    ADLs  Overall ADL's : Needs assistance/impaired Grooming: Wash/dry face,Moderate assistance,Bed level Grooming Details (indicate cue type and reason): washing face and cues to avoid L side of head Lower Body Dressing: Total assistance Lower Body Dressing Details (indicate cue type and reason): supine to don socks General ADL Comments: pt lethargic but able to progress to eOB then chair positioning    Mobility  Overal bed mobility: Needs Assistance Bed Mobility: Supine to Sit Rolling: Min assist Supine to sit: Mod assist,+2 for safety/equipment,HOB elevated Sit to supine: Mod assist,+2 for physical assistance General bed mobility comments: Able to initiate moving LEs to EOB, assist with trunk and rest of LE movement. Left lateral lean initially.    Transfers  Overall transfer level: Needs assistance Equipment used: 2 person hand held assist Transfers: Sit to/from Stand Sit to Stand: +2 physical assistance,Mod assist Stand pivot transfers: +2 physical assistance,Mod assist General transfer comment: Assist to power to standing with pt initiating well with cues. Able to take a few steps to chair towards right side with assist for weight shifting, balance/line management.    Ambulation / Gait / Stairs / Wheelchair Mobility  Ambulation/Gait Ambulation/Gait assistance: Mod assist,+2 physical assistance Gait Distance (Feet): 5 Feet Assistive device: 1 person hand held assist Gait Pattern/deviations: Step-to pattern,Decreased stride length,Trunk flexed General Gait Details: Deferred due to lethargy Gait velocity: decreased Gait velocity interpretation: <1.31 ft/sec, indicative of household ambulator    Posture / Balance Dynamic Sitting Balance Sitting balance - Comments: Requires Min-mod A for support due to left lateral lean. Worked on finding midline with  trunk, anterior pelvic tilt, upright posture and cervical neck stretching/positioning/AROM. Able to reach for rail with RUE to maintain balance with Min guard. Posterior lean with LE AROM. Balance Overall balance assessment: Needs assistance Sitting-balance support: Feet supported,No upper extremity supported Sitting balance-Leahy Scale: Poor Sitting balance - Comments: Requires Min-mod A for support due to left lateral lean. Worked on finding midline with trunk, anterior pelvic tilt, upright posture and cervical neck stretching/positioning/AROM. Able to reach for rail with RUE to maintain balance with Min guard. Posterior lean with LE AROM. Postural control: Posterior lean Standing balance support: During functional activity Standing balance-Leahy Scale: Poor Standing balance comment: reliant on external support    Special needs/care consideration Skin *** and Special service needs ***     Previous Home Environment (from acute therapy documentation) Living Arrangements: Spouse/significant other,Children  Lives With: Daughter,Significant other Available Help at Discharge: Family,Available 24 hours/day Type of Home: Apartment Home Layout: Two level Alternate Level Stairs-Rails: Right Alternate Level Stairs-Number of Steps: ~13 Home Access: Level entry Bathroom Shower/Tub: Chiropodist: Standard Bathroom Accessibility: No Home Care Services: No Additional Comments: pt lives with his girlfriend and 9yo daughter in home with level entry but bed/bath upstairs. Pt mother present and states pt can stay with her at d/c. she has a hospital bed on first floor  Discharge Living Setting Plans for Discharge Living Setting: House (mother's house) Type of Home at Discharge: [facility]  Layout: Two level,1/2 bath on main level (mother has hospital bed downstairs that pt can use) Alternate Level Stairs-Number of Steps: ~12-15 Discharge Home Access: Stairs to  enter Entrance Stairs-Number of Steps: 1 Discharge Bathroom Shower/Tub: Walk-in shower,Tub/shower unit (both upstairs) Discharge Bathroom Toilet: Handicapped height Discharge Bathroom Accessibility: Yes How Accessible: Accessible via walker Does the patient have any problems obtaining your medications?: No  Social/Family/Support Systems Patient Roles: Parent Contact Information: Inpatient Rehab Admissions Coordinator:      I am following for potential CIR admit once medically ready. Pt. Is currently medically unstable. I have also reached out to patient's mother and left voicemail with request for call back to discuss support at d/c.     Clemens Catholic, [place], [place]  Rehab Admissions Coordinator   [phone]  [phone] (office) Anticipated Caregiver: Edmund Hilda, mother Anticipated Caregiver's Contact Information: [phone] Caregiver Availability: 24/7 Discharge Plan Discussed with Primary Caregiver: Yes Is Caregiver In Agreement with Plan?: Yes Does Caregiver/Family have Issues with Lodging/Transportation while Pt is in Rehab?: No   Goals Patient/Family Goal for Rehab: supervision and min assist  with PT, supervision and min assist with OT, supervision and min assist with SLP. Expected length of stay: 18-21 days Pt/Family Agrees to Admission and willing to participate: Yes Program Orientation Provided & Reviewed with Pt/Caregiver Including Roles  & Responsibilities: Yes   Decrease burden of Care through IP rehab admission: Specialzed equipment needs, Decannulation, Diet advancement, Decrease number of caregivers, Bowel and bladder program and Patient/family education   Possible need for SNF placement upon discharge:not anticipated   Patient Condition: {PATIENT'S CONDITION:22832}  Preadmission Screen Completed By:  Genella Mech, CCC-SLP, 02/18/2021 3:56 PM ______________________________________________________________________   Discussed status with Dr. Marland Kitchenon***at  *** and received approval for admission today.  Admission Coordinator:  Genella Mech, time***/Date***

## 2023-10-15 ENCOUNTER — Encounter (HOSPITAL_COMMUNITY): Payer: Self-pay

## 2023-10-15 ENCOUNTER — Other Ambulatory Visit: Payer: Self-pay

## 2023-10-15 ENCOUNTER — Encounter (HOSPITAL_COMMUNITY): Payer: Self-pay | Admitting: Internal Medicine

## 2023-10-15 ENCOUNTER — Inpatient Hospital Stay (HOSPITAL_COMMUNITY)
Admission: AD | Admit: 2023-10-15 | Discharge: 2023-10-18 | DRG: 322 | Disposition: A | Discharge status: Home / Self Care | Payer: Managed Care, Other (non HMO) | Source: Other Acute Inpatient Hospital | Attending: Internal Medicine | Admitting: Internal Medicine

## 2023-10-15 DIAGNOSIS — Z79899 Other long term (current) drug therapy: Secondary | ICD-10-CM

## 2023-10-15 DIAGNOSIS — I214 Non-ST elevation (NSTEMI) myocardial infarction: Secondary | ICD-10-CM | POA: Diagnosis present

## 2023-10-15 DIAGNOSIS — I2510 Atherosclerotic heart disease of native coronary artery without angina pectoris: Secondary | ICD-10-CM | POA: Diagnosis present

## 2023-10-15 DIAGNOSIS — Z7982 Long term (current) use of aspirin: Secondary | ICD-10-CM | POA: Diagnosis not present

## 2023-10-15 DIAGNOSIS — Z91048 Other nonmedicinal substance allergy status: Secondary | ICD-10-CM | POA: Diagnosis not present

## 2023-10-15 DIAGNOSIS — Z955 Presence of coronary angioplasty implant and graft: Secondary | ICD-10-CM

## 2023-10-15 DIAGNOSIS — I10 Essential (primary) hypertension: Secondary | ICD-10-CM | POA: Diagnosis present

## 2023-10-15 DIAGNOSIS — R55 Syncope and collapse: Secondary | ICD-10-CM | POA: Diagnosis not present

## 2023-10-15 DIAGNOSIS — E785 Hyperlipidemia, unspecified: Secondary | ICD-10-CM | POA: Diagnosis present

## 2023-10-15 DIAGNOSIS — I209 Angina pectoris, unspecified: Secondary | ICD-10-CM

## 2023-10-15 LAB — HEPARIN LEVEL (UNFRACTIONATED): Heparin Unfractionated: 0.3 [IU]/mL (ref 0.30–0.70)

## 2023-10-15 MED ORDER — ACETAMINOPHEN 325 MG PO TABS
650.0000 mg | ORAL_TABLET | ORAL | Status: DC | PRN
Start: 2023-10-15 — End: 2023-10-18
  Administered 2023-10-17: 650 mg via ORAL
  Filled 2023-10-15: qty 2

## 2023-10-15 MED ORDER — ONDANSETRON HCL 4 MG/2ML IJ SOLN
4.0000 mg | Freq: Four times a day (QID) | INTRAMUSCULAR | Status: DC | PRN
Start: 2023-10-15 — End: 2023-10-18

## 2023-10-15 MED ORDER — ASPIRIN 81 MG PO TBEC
81.0000 mg | DELAYED_RELEASE_TABLET | Freq: Every day | ORAL | Status: DC
  Administered 2023-10-16 – 2023-10-18 (×2): 81 mg via ORAL
  Filled 2023-10-15 (×2): qty 1

## 2023-10-15 MED ORDER — NITROGLYCERIN 0.4 MG SL SUBL: 0.4000 mg | SUBLINGUAL_TABLET | SUBLINGUAL | Status: DC | PRN

## 2023-10-15 MED ORDER — HEPARIN (PORCINE) 25000 UT/250ML-% IV SOLN
1250.0000 [IU]/h | INTRAVENOUS | Status: DC
  Administered 2023-10-16: 1100 [IU]/h via INTRAVENOUS
  Administered 2023-10-17: 1250 [IU]/h via INTRAVENOUS
  Filled 2023-10-15 (×2): qty 250

## 2023-10-15 MED ORDER — CARVEDILOL 12.5 MG PO TABS
12.5000 mg | ORAL_TABLET | Freq: Two times a day (BID) | ORAL | Status: DC
  Administered 2023-10-16 – 2023-10-18 (×5): 12.5 mg via ORAL
  Filled 2023-10-15 (×5): qty 1

## 2023-10-15 MED ORDER — ATORVASTATIN CALCIUM 40 MG PO TABS
40.0000 mg | ORAL_TABLET | Freq: Every day | ORAL | Status: DC
  Administered 2023-10-16 – 2023-10-18 (×3): 40 mg via ORAL
  Filled 2023-10-15 (×3): qty 1

## 2023-10-15 NOTE — H&P (Signed)
Cardiology Admission History and Physical   Patient ID: Kent Floyd MRN: [id number]; DOB: August 14, 1971   Admission date: 10/15/2023  PCP:  Kent Bussing, MD   [facility] Providers Cardiologist:  None        Chief Complaint:  Chest pain  Patient Profile:   Kent Floyd is a 52 y.o. male without a significant past medical  who is being seen 10/15/2023 for the evaluation of chest pain.  History of Present Illness:   Kent Floyd states earlier today at around 10 am while walking at the [facility] this morning he suddenly noticed a burning sensation at his left chest that went to his should blades.  He soon noticed some sweating and lightheadedness that lasting a few minutes and resolved.  Patient states that [place] for which he was given aspirin and had an ECG.  He was not brought to the emergency room.  Due to ongoing chest pain, he decided to go to his local emergency department for further evaluation.  He states a similar episode of chest pain occurred last week while at work and last a few minutes.  He has had in total 3 different episodes of chest pain in the last few months.  He is now reporting 2/10 chest pain.  Patient states that he is being seen by a primary care provider on a regular basis and only has 'borderline' hypertension for which he has been monitored for.   At [facility], initial HS-troponin was 294 ng/L and went up to 325 ng/L.  Chest CT angiography was performed and ruled out pulmonary embolism.  Based on note documentation, ECG was without any signs of ischemia or infarct.  Patient was given high-dose aspirin and initiated on heparin drip with initial bolus.  Patient was accepted to our service by Dr. Lewayne Floyd.   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   Procedure: FASCIECTOMY LEFT SMALL FINGER;  Surgeon: Kent Salt, MD;  Location: [facility];  Service: Orthopedics;  Laterality: Left;  AXILLARY BLOCK   ESOPHAGOGASTRODUODENOSCOPY (EGD) WITH PROPOFOL N/A 10/06/2020   Procedure: ESOPHAGOGASTRODUODENOSCOPY (EGD) WITH PROPOFOL;  Surgeon: Kent Mood, MD;  Location: [facility] ENDOSCOPY;  Service: Gastroenterology;  Laterality: N/A;     Medications Prior to Admission: Prior to Admission medications   Not on File     Allergies:    Allergies  Allergen Reactions   Tree Extract Other (See Comments)    Throat itching    Social History:   Social History   Socioeconomic History   Marital status: Married    Spouse name: Not on file   Number of children: 2   Years of education: college   Highest education level: Bachelor's degree (e.g., BA, AB, BS)  Occupational History   Occupation: Charity fundraiser  Tobacco Use   Smoking status: Never   Smokeless tobacco: Never  Vaping Use   Vaping status: Never Used  Substance and Sexual Activity   Alcohol use: Yes    Alcohol/week: 0.0 standard drinks of alcohol    Comment: 2 drinks per month   Drug use: No   Sexual activity: Not on file  Other  Topics Concern   Not on file  Social History Narrative   Lives at home with his wife and child.   Right-handed.   Caffeine per day: cup of coffee in morning, glass of tea at lunch and dinner   Social Determinants of Health   Financial Resource Strain: Not on file  Food Insecurity: Not on file  Transportation Needs: Not on file  Physical Activity: Not on file  Stress: Not on file  Social Connections: Not on file  Intimate Partner Violence: Not on file    Family History:   The patient's family history includes Healthy in his mother; Skin cancer in his father.     ROS:  Please see the history of present illness.  All other ROS reviewed and negative.     Physical Exam/Data:   Vitals:   10/15/23 1850  BP: (!) 163/97  Pulse: 79  Resp: 17  Temp: 98.2 F (36.8 C)  TempSrc: Oral  Weight: 102 kg  Height: 6\' 2"  (1.88 m)   No intake or output data in the 24 hours ending 10/15/23 2037    10/15/2023    6:50 PM 12/04/2020    9:07 AM 11/28/2020   11:08 AM  Last 3 Weights  Weight (lbs) 224 lb 13.9 oz 217 lb 13 oz 215 lb  Weight (kg) 102 kg 98.8 kg 97.523 kg     Body mass index is 28.87 kg/m.  General:  Well nourished, well developed, in no acute distress HEENT: normal Neck: no JVD Vascular: No carotid bruits; Distal pulses 2+ bilaterally   Cardiac:  normal S1, S2; RRR; no murmur  Lungs:  clear to auscultation bilaterally, no wheezing, rhonchi or rales  Abd: soft, nontender, no hepatomegaly  Ext: no edema Musculoskeletal:  No deformities, BUE and BLE strength normal and equal Skin: warm and dry  Neuro:  CNs 2-12 intact, no focal abnormalities noted Psych:  Normal affect    EKG:  The ECG that was done  was personally reviewed and demonstrates:  Pending (outside facility did not report STEMI)  Relevant CV Studies: None  Laboratory Data:  High Sensitivity Troponin:  No results for input(s): "TROPONINIHS" in the last 720 hours.    ChemistryNo results for input(s): "NA", "K", "CL", "CO2", "GLUCOSE", "BUN", "CREATININE", "CALCIUM", "MG", "GFRNONAA", "GFRAA", "ANIONGAP" in the last 168 hours.  No results for input(s): "PROT", "ALBUMIN", "AST", "ALT", "ALKPHOS", "BILITOT" in the last 168 hours. Lipids No results for input(s): "CHOL", "TRIG", "HDL", "LABVLDL", "LDLCALC", "CHOLHDL" in the last 168 hours. HematologyNo results for input(s): "WBC", "RBC", "HGB", "HCT", "MCV", "MCH", "MCHC", "RDW", "PLT" in the last 168 hours. Thyroid No results for input(s): "TSH", "FREET4" in the last 168 hours. BNPNo results for input(s): "BNP", "PROBNP" in  the last 168 hours.  DDimer No results for input(s): "DDIMER" in the last 168 hours.   Radiology/Studies:  No results found.   Assessment and Plan:   Chest pain: Patient with new onset chest pain in the setting of an elevated and rising HS-troponin, consistent with NSTEMI-ACS.  Patient had chest CT angiography that ruled out pulmonary embolism and aortic dissection.  Low-suspicion for myopericarditis or coronary artery dissection at this time.  Patient is now chest pain free.  ECG is without signs of ischemia/infarct.  He is currently receiving aspirin and heparin drip. --If patient remain stable, arrange for coronary angiograph on Monday. --Arrange for echocardiogram in the morning. --Continue aspirin and heparin drip. --Start atrovastatin 40 mg daily and metoprolol tartrate 25 mg twice  daily --Check lipid panel and A1c for further secondary prevention assessment.     Risk Assessment/Risk Scores:    TIMI Risk Score for Unstable Angina or Non-ST Elevation MI:   The patient's TIMI risk score is 2, which indicates a 8% risk of all cause mortality, new or recurrent myocardial infarction or need for urgent revascularization in the next 14 days.      Code Status: Full Code  Severity of Illness: The appropriate patient status for this patient is INPATIENT. Inpatient status is judged to be reasonable and necessary in order to provide the required intensity of service to ensure the patient's safety. The patient's presenting symptoms, physical exam findings, and initial radiographic and laboratory data in the context of their chronic comorbidities is felt to place them at high risk for further clinical deterioration. Furthermore, it is not anticipated that the patient will be medically stable for discharge from the hospital within 2 midnights of admission.   * I certify that at the point of admission it is my clinical judgment that the patient will require inpatient hospital care spanning  beyond 2 midnights from the point of admission due to high intensity of service, high risk for further deterioration and high frequency of surveillance required.*   For questions or updates, please contact [facility] Please consult [email or web address] for contact info under     Signed, Judie Grieve, MD  10/15/2023 8:37 PM    Attending Attestation.  Patient seen and examined, note reviewed with the signed Resident Physician/Advanced Practice Provider. I personally reviewed laboratory data, imaging studies and relevant notes. I independently examined the patient and formulated the important aspects of the plan. I have personally discussed the plan with the patient and/or family. Comments or changes to the note/plan are indicated below.  My Exam:  General: Well nourished, well developed, in no acute distress Head: Atraumatic, normal size  Eyes: PEERLA, EOMI  Neck: Supple, no JVD Endocrine: No thryomegaly Cardiac: Normal S1, S2; RRR; no murmurs, rubs, or gallops Lungs: Clear to auscultation bilaterally, no wheezing, rhonchi or rales  Abd: Soft, nontender, no hepatomegaly  Ext: No edema, pulses 2+ Musculoskeletal: No deformities, BUE and BLE strength normal and equal Skin: Warm and dry, no rashes   Neuro: Alert and oriented to person, place, time, and situation, CNII-XII grossly intact, no focal deficits  Psych: Normal Floyd and affect   Telemetry: SR 70s EKG: Normal sinus rhythm heart rate 72, incomplete right bundle branch block, questionable inferior infarct Echo: Pending Cath: Pending  Assessment & Plan: 52 year old male without medical history admitted on 10/16/2023 for non-STEMI.  # Non-STEMI -Evaluated at [facility].  Troponins were elevated there.  Describe burning sensation in chest while walking.  No further chest pain symptoms.  EKG shows no ST elevation.  Working diagnosis is non-STEMI. -N.p.o. for left heart catheterization tomorrow.  Risk and benefits  explained.  He is willing to proceed. -Continue aspirin and heparin drip. -Lipids LP(a) pending.  I have ordered a TSH and A1c for tomorrow. -Echo today. -Continue carvedilol 12.5 mg twice daily.  FEN -Diet: Heart healthy, n.p.o. at midnight -DVT PPx: Heparin drip -Code: Full -Precath fluids -Disposition: Pending left heart catheterization tomorrow.  Signed, Lenna Gilford. Flora Lipps, MD [facility]  [facility]  10/16/2023 8:38 AM

## 2023-10-15 NOTE — Progress Notes (Signed)
Notified MD patient has arrived to unit

## 2023-10-15 NOTE — Progress Notes (Signed)
PHARMACY - ANTICOAGULATION CONSULT NOTE  Pharmacy Consult for heparin Indication: chest pain/ACS  Allergies  Allergen Reactions   Tree Extract Other (See Comments)    Throat itching    Patient Measurements: Height: 6\' 2"  (188 cm) Weight: 102 kg (224 lb 13.9 oz) IBW/kg (Calculated) : 82.2 Heparin Dosing Weight: actual BW  Vital Signs: Temp: 98.2 F (36.8 C) (10/26 1850) Temp Source: Oral (10/26 1850) BP: 163/97 (10/26 1850) Pulse Rate: 79 (10/26 1850)  Labs: No results for input(s): "HGB", "HCT", "PLT", "APTT", "LABPROT", "INR", "HEPARINUNFRC", "HEPRLOWMOCWT", "CREATININE", "CKTOTAL", "CKMB", "TROPONINIHS" in the last 72 hours.  CrCl cannot be calculated (Patient's most recent lab result is older than the maximum 21 days allowed.).   Medical History: Past Medical History:  Diagnosis Date   Headache     Assessment: 52 yo male presenting with chest pain and elevated troponins transferred from [facility] ED for ischemic evaluation.  No anticoagulation PTA.   CBC from OSH: Hgb 15.3, pltc 253  Heparin bolus 4000 units x1 given at ~1500 and started on 1020 units/hr (confirmed rate upon transfer).  Heparin level 0.30, therapeutic on 1020 units/hr (drawn ~6.5h after bolus).    Goal of Therapy:  Heparin level 0.3-0.7 units/ml Monitor platelets by anticoagulation protocol: Yes   Plan:  Increase heparin to 1100 units/hr Daily heparin level, CBC F/u intervention plans  Jolean Madariaga U Paytes 10/15/2023,7:52 PM

## 2023-10-16 ENCOUNTER — Inpatient Hospital Stay (HOSPITAL_COMMUNITY): Payer: Managed Care, Other (non HMO)

## 2023-10-16 DIAGNOSIS — I10 Essential (primary) hypertension: Secondary | ICD-10-CM

## 2023-10-16 DIAGNOSIS — I214 Non-ST elevation (NSTEMI) myocardial infarction: Secondary | ICD-10-CM

## 2023-10-16 DIAGNOSIS — I209 Angina pectoris, unspecified: Secondary | ICD-10-CM

## 2023-10-16 LAB — BASIC METABOLIC PANEL
Anion gap: 10 (ref 5–15)
BUN: 8 mg/dL (ref 6–20)
CO2: 24 mmol/L (ref 22–32)
Calcium: 8.5 mg/dL — ABNORMAL LOW (ref 8.9–10.3)
Chloride: 105 mmol/L (ref 98–111)
Creatinine, Ser: 0.95 mg/dL (ref 0.61–1.24)
GFR, Estimated: >60 mL/min (ref >60)
Glucose, Bld: 115 mg/dL — ABNORMAL HIGH (ref 70–99)
Potassium: 3.6 mmol/L (ref 3.5–5.1)
Sodium: 139 mmol/L (ref 135–145)

## 2023-10-16 LAB — HEPARIN LEVEL (UNFRACTIONATED)
Heparin Unfractionated: 0.28 [IU]/mL — ABNORMAL LOW (ref 0.30–0.70)
Heparin Unfractionated: 0.28 [IU]/mL — ABNORMAL LOW (ref 0.30–0.70)

## 2023-10-16 LAB — ECHOCARDIOGRAM COMPLETE
AR max vel: 3.19 cm2
AV Area VTI: 3.35 cm2
AV Area mean vel: 3.11 cm2
AV Mean grad: 3 mm[Hg]
AV Peak grad: 5.4 mm[Hg]
Ao pk vel: 1.16 m/s
Area-P 1/2: 2.8 cm2
Height: 74 in
S' Lateral: 3.1 cm
Weight: 3597.91 [oz_av]

## 2023-10-16 LAB — CBC
HCT: 45.2 % (ref 39.0–52.0)
Hemoglobin: 15.2 g/dL (ref 13.0–17.0)
MCH: 28.6 pg (ref 26.0–34.0)
MCHC: 33.6 g/dL (ref 30.0–36.0)
MCV: 85.1 fL (ref 80.0–100.0)
Platelets: 254 10*3/uL (ref 150–400)
RBC: 5.31 MIL/uL (ref 4.22–5.81)
RDW: 12.7 % (ref 11.5–15.5)
WBC: 6.3 10*3/uL (ref 4.0–10.5)
nRBC: 0 % (ref 0.0–0.2)

## 2023-10-16 LAB — LIPID PANEL
Cholesterol: 163 mg/dL (ref 0–200)
HDL: 53 mg/dL (ref >40)
LDL Cholesterol: 97 mg/dL (ref 0–99)
Total CHOL/HDL Ratio: 3.1 {ratio}
Triglycerides: 66 mg/dL (ref <150)
VLDL: 13 mg/dL (ref 0–40)

## 2023-10-16 LAB — LIPOPROTEIN A (LPA): Lipoprotein (a): 17.6 nmol/L (ref <75.0)

## 2023-10-16 LAB — HIV ANTIBODY (ROUTINE TESTING W REFLEX): HIV Screen 4th Generation wRfx: NONREACTIVE

## 2023-10-16 MED ORDER — SODIUM CHLORIDE 0.9 % IV SOLN: INTRAVENOUS | Status: DC

## 2023-10-16 MED ORDER — ASPIRIN 81 MG PO CHEW
81.0000 mg | CHEWABLE_TABLET | ORAL | Status: AC
  Administered 2023-10-17: 81 mg via ORAL
  Filled 2023-10-16: qty 1

## 2023-10-16 NOTE — Progress Notes (Signed)
PHARMACY - ANTICOAGULATION CONSULT NOTE  Pharmacy Consult for heparin Indication: chest pain/ACS  Allergies  Allergen Reactions   Tree Extract Other (See Comments)    Throat itching    Patient Measurements: Height: 6\' 2"  (188 cm) Weight: 102 kg (224 lb 13.9 oz) IBW/kg (Calculated) : 82.2 Heparin Dosing Weight: actual BW  Vital Signs: Temp: 98.9 F (37.2 C) (10/27 0802) Temp Source: Oral (10/27 0802) BP: 139/86 (10/27 0907) Pulse Rate: 76 (10/27 0907)  Labs: Recent Labs    10/15/23 2124 10/16/23 0437 10/16/23 0854  HGB  --  15.2  --   HCT  --  45.2  --   PLT  --  254  --   HEPARINUNFRC 0.30  --  0.28*  CREATININE  --  0.95  --     Estimated Creatinine Clearance: 117.2 mL/min (by C-G formula based on SCr of 0.95 mg/dL).   Medical History: Past Medical History:  Diagnosis Date   Headache     Assessment: 52 yo male presenting with chest pain and elevated troponins transferred from [facility] ED for ischemic evaluation.  No anticoagulation PTA.   Heparin level 0.28 on 1100 units/hr. CBC stable, no signs of bleeding or issues with infusion per RN. If patient remains stable, coronary angiography to be done on 10/28. Echo scheduled for today.   Goal of Therapy:  Heparin level 0.3-0.7 units/ml Monitor platelets by anticoagulation protocol: Yes   Plan:  Increase heparin to 1250 units/hr 6 hour heparin level  Daily heparin level, CBC Monitor for s/sx of bleeding  F/u echo and cath   Jerryl Holzhauer I Dywane Peruski 10/16/2023,9:29 AM

## 2023-10-16 NOTE — Progress Notes (Signed)
*  PRELIMINARY RESULTS* Echocardiogram 2D Echocardiogram has been performed.  Kent Floyd Havey 10/16/2023, 11:22 AM

## 2023-10-17 ENCOUNTER — Encounter (HOSPITAL_COMMUNITY)
Admission: AD | Disposition: A | Discharge status: Home / Self Care | Payer: Self-pay | Source: Other Acute Inpatient Hospital | Attending: Internal Medicine

## 2023-10-17 ENCOUNTER — Other Ambulatory Visit (HOSPITAL_COMMUNITY): Payer: Self-pay

## 2023-10-17 DIAGNOSIS — I214 Non-ST elevation (NSTEMI) myocardial infarction: Secondary | ICD-10-CM | POA: Diagnosis not present

## 2023-10-17 DIAGNOSIS — E785 Hyperlipidemia, unspecified: Secondary | ICD-10-CM | POA: Diagnosis not present

## 2023-10-17 DIAGNOSIS — I2510 Atherosclerotic heart disease of native coronary artery without angina pectoris: Secondary | ICD-10-CM | POA: Diagnosis not present

## 2023-10-17 HISTORY — PX: CORONARY STENT INTERVENTION: CATH118234

## 2023-10-17 HISTORY — PX: LEFT HEART CATH AND CORONARY ANGIOGRAPHY: CATH118249

## 2023-10-17 LAB — CBC
HCT: 44.5 % (ref 39.0–52.0)
Hemoglobin: 14.9 g/dL (ref 13.0–17.0)
MCH: 29.1 pg (ref 26.0–34.0)
MCHC: 33.5 g/dL (ref 30.0–36.0)
MCV: 86.9 fL (ref 80.0–100.0)
Platelets: 244 10*3/uL (ref 150–400)
RBC: 5.12 MIL/uL (ref 4.22–5.81)
RDW: 12.8 % (ref 11.5–15.5)
WBC: 5.9 10*3/uL (ref 4.0–10.5)
nRBC: 0 % (ref 0.0–0.2)

## 2023-10-17 LAB — TSH: TSH: 4.531 u[IU]/mL — ABNORMAL HIGH (ref 0.350–4.500)

## 2023-10-17 LAB — BASIC METABOLIC PANEL
Anion gap: 8 (ref 5–15)
BUN: 9 mg/dL (ref 6–20)
CO2: 22 mmol/L (ref 22–32)
Calcium: 8.4 mg/dL — ABNORMAL LOW (ref 8.9–10.3)
Chloride: 109 mmol/L (ref 98–111)
Creatinine, Ser: 0.94 mg/dL (ref 0.61–1.24)
GFR, Estimated: >60 mL/min (ref >60)
Glucose, Bld: 106 mg/dL — ABNORMAL HIGH (ref 70–99)
Potassium: 4 mmol/L (ref 3.5–5.1)
Sodium: 139 mmol/L (ref 135–145)

## 2023-10-17 LAB — HEMOGLOBIN A1C
Hgb A1c MFr Bld: 5.6 % (ref 4.8–5.6)
Mean Plasma Glucose: 114.02 mg/dL

## 2023-10-17 LAB — POCT ACTIVATED CLOTTING TIME: Activated Clotting Time: 379 s

## 2023-10-17 SURGERY — LEFT HEART CATH AND CORONARY ANGIOGRAPHY
Anesthesia: LOCAL

## 2023-10-17 MED ORDER — VERAPAMIL HCL 2.5 MG/ML IV SOLN
INTRAVENOUS | Status: DC | PRN
  Administered 2023-10-17: 10 mL via INTRA_ARTERIAL

## 2023-10-17 MED ORDER — MIDAZOLAM HCL 2 MG/2ML IJ SOLN
INTRAMUSCULAR | Status: AC
  Filled 2023-10-17: qty 2

## 2023-10-17 MED ORDER — SODIUM CHLORIDE 0.9 % IV SOLN: 250.0000 mL | INTRAVENOUS | Status: DC | PRN

## 2023-10-17 MED ORDER — MIDAZOLAM HCL 2 MG/2ML IJ SOLN
INTRAMUSCULAR | Status: DC | PRN
  Administered 2023-10-17: 2 mg via INTRAVENOUS
  Administered 2023-10-17: 1 mg via INTRAVENOUS

## 2023-10-17 MED ORDER — HEPARIN (PORCINE) IN NACL 1000-0.9 UT/500ML-% IV SOLN
INTRAVENOUS | Status: DC | PRN
  Administered 2023-10-17 (×2): 500 mL

## 2023-10-17 MED ORDER — LIDOCAINE HCL (PF) 1 % IJ SOLN
INTRAMUSCULAR | Status: AC
  Filled 2023-10-17: qty 30

## 2023-10-17 MED ORDER — ISOSORBIDE MONONITRATE ER 30 MG PO TB24
30.0000 mg | ORAL_TABLET | Freq: Every day | ORAL | Status: DC
  Administered 2023-10-17 – 2023-10-18 (×2): 30 mg via ORAL
  Filled 2023-10-17 (×2): qty 1

## 2023-10-17 MED ORDER — SODIUM CHLORIDE 0.9% FLUSH: 3.0000 mL | INTRAVENOUS | Status: DC | PRN

## 2023-10-17 MED ORDER — FENTANYL CITRATE (PF) 100 MCG/2ML IJ SOLN
INTRAMUSCULAR | Status: AC
  Filled 2023-10-17: qty 2

## 2023-10-17 MED ORDER — NITROGLYCERIN 1 MG/10 ML FOR IR/CATH LAB
INTRA_ARTERIAL | Status: AC
  Filled 2023-10-17: qty 10

## 2023-10-17 MED ORDER — TICAGRELOR 90 MG PO TABS
90.0000 mg | ORAL_TABLET | Freq: Two times a day (BID) | ORAL | Status: DC
  Administered 2023-10-17 – 2023-10-18 (×2): 90 mg via ORAL
  Filled 2023-10-17 (×2): qty 1

## 2023-10-17 MED ORDER — IOHEXOL 350 MG/ML SOLN
INTRAVENOUS | Status: DC | PRN
  Administered 2023-10-17: 120 mL

## 2023-10-17 MED ORDER — LABETALOL HCL 5 MG/ML IV SOLN
10.0000 mg | INTRAVENOUS | Status: AC | PRN
Start: 2023-10-17 — End: 2023-10-17

## 2023-10-17 MED ORDER — HYDRALAZINE HCL 20 MG/ML IJ SOLN
10.0000 mg | INTRAMUSCULAR | Status: AC | PRN
Start: 2023-10-17 — End: 2023-10-17

## 2023-10-17 MED ORDER — TICAGRELOR 90 MG PO TABS
90.0000 mg | ORAL_TABLET | Freq: Two times a day (BID) | ORAL | 11 refills | Status: DC
  Filled 2023-10-17: qty 60, 30d supply, fill #0

## 2023-10-17 MED ORDER — HEPARIN SODIUM (PORCINE) 1000 UNIT/ML IJ SOLN
INTRAMUSCULAR | Status: DC | PRN
  Administered 2023-10-17 (×2): 5000 [IU] via INTRAVENOUS

## 2023-10-17 MED ORDER — SODIUM CHLORIDE 0.9% FLUSH: 3.0000 mL | Freq: Two times a day (BID) | INTRAVENOUS | Status: DC

## 2023-10-17 MED ORDER — HEPARIN SODIUM (PORCINE) 1000 UNIT/ML IJ SOLN
INTRAMUSCULAR | Status: AC
Start: 2023-10-17 — End: ?
  Filled 2023-10-17: qty 10

## 2023-10-17 MED ORDER — FENTANYL CITRATE (PF) 100 MCG/2ML IJ SOLN
INTRAMUSCULAR | Status: DC | PRN
  Administered 2023-10-17 (×2): 25 ug via INTRAVENOUS

## 2023-10-17 MED ORDER — LIDOCAINE HCL (PF) 1 % IJ SOLN
INTRAMUSCULAR | Status: DC | PRN
  Administered 2023-10-17: 2 mL

## 2023-10-17 MED ORDER — NITROGLYCERIN 1 MG/10 ML FOR IR/CATH LAB
INTRA_ARTERIAL | Status: DC | PRN
  Administered 2023-10-17: 200 ug via INTRACORONARY

## 2023-10-17 MED ORDER — VERAPAMIL HCL 2.5 MG/ML IV SOLN
INTRAVENOUS | Status: AC
Start: 2023-10-17 — End: ?
  Filled 2023-10-17: qty 2

## 2023-10-17 MED ORDER — TICAGRELOR 90 MG PO TABS
ORAL_TABLET | ORAL | Status: AC
  Filled 2023-10-17: qty 2

## 2023-10-17 MED ORDER — TICAGRELOR 90 MG PO TABS
ORAL_TABLET | ORAL | Status: DC | PRN
  Administered 2023-10-17: 180 mg via ORAL

## 2023-10-17 MED ORDER — SODIUM CHLORIDE 0.9 % IV SOLN
INTRAVENOUS | Status: AC
Start: 2023-10-17 — End: 2023-10-17

## 2023-10-17 SURGICAL SUPPLY — 19 items
BALLN SAPPHIRE 3.0X12 (BALLOONS) ×1
BALLN ~~LOC~~ EMERGE MR 4.0X12 (BALLOONS) ×1
BALLOON SAPPHIRE 3.0X12 (BALLOONS) IMPLANT
BALLOON ~~LOC~~ EMERGE MR 4.0X12 (BALLOONS) IMPLANT
CATH INFINITI AMBI 5FR TG (CATHETERS) IMPLANT
CATH VISTA GUIDE 6FR XB3.5 (CATHETERS) IMPLANT
DEVICE RAD COMP TR BAND LRG (VASCULAR PRODUCTS) IMPLANT
ELECT DEFIB PAD ADLT CADENCE (PAD) IMPLANT
GLIDESHEATH SLEND SS 6F .021 (SHEATH) IMPLANT
GUIDEWIRE INQWIRE 1.5J.035X260 (WIRE) IMPLANT
INQWIRE 1.5J .035X260CM (WIRE) ×1
KIT ENCORE 26 ADVANTAGE (KITS) IMPLANT
PACK CARDIAC CATHETERIZATION (CUSTOM PROCEDURE TRAY) ×1 IMPLANT
SET ATX-X65L (MISCELLANEOUS) IMPLANT
SHEATH PROBE COVER 6X72 (BAG) IMPLANT
STENT SYNERGY XD 3.50X16 (Permanent Stent) IMPLANT
SYNERGY XD 3.50X16 (Permanent Stent) ×1 IMPLANT
TUBING CIL FLEX 10 FLL-RA (TUBING) IMPLANT
WIRE ASAHI PROWATER 180CM (WIRE) IMPLANT

## 2023-10-17 NOTE — Progress Notes (Signed)
CARDIAC REHAB PHASE I     Post MI/stent education including restrictions, risk factors, exercise guidelines, antiplatelet therapy importance, MI booklet, NTG use, heart healthy diet, and CRP2 reviewed. All questions and concerns addressed. Will refer to [facility] for CRP2. Will continue to follow.   [id number] Woodroe Chen, RN BSN 10/17/2023 12:48 PM

## 2023-10-17 NOTE — TOC Initial Note (Signed)
Transition of Care [facility]) - Initial/Assessment Note    Patient Details  Name: Kent Floyd MRN: [id number] Date of Birth: [birth year]  Transition of [facility]) CM/SW Contact:    Ronny Bacon, RN Phone Number: 10/17/2023, 1:48 PM  Clinical Narrative:   Patient from home. Spouse at bedside. Patient had cath done today, while eating patient had episode of feeling lightheaded where his heart rate went low. Provider at bedside plan is to see how patient does rest of day with ambulating. Possible discharge today or tomorrow. Brilinta copay $40 per pharmacy. Patient given Brilinta copay card.                Expected Discharge Plan: Home/Self Care Barriers to Discharge: Continued Medical Work up   Patient Goals and CMS Choice            Expected Discharge Plan and Services       Living arrangements for the past 2 months: Single Family Home                                      Prior Living Arrangements/Services Living arrangements for the past 2 months: Single Family Home Lives with:: Spouse, Minor Children Patient language and need for interpreter reviewed:: Yes Do you feel safe going back to the place where you live?: Yes      Need for Family Participation in Patient Care: No (Comment) Care giver support system in place?: Yes (comment)   Criminal Activity/Legal Involvement Pertinent to Current Situation/Hospitalization: No - Comment as needed  Activities of Daily Living   ADL Screening (condition at time of admission) Independently performs ADLs?: Yes (appropriate for developmental age) Is the patient deaf or have difficulty hearing?: No Does the patient have difficulty seeing, even when wearing glasses/contacts?: No Does the patient have difficulty concentrating, remembering, or making decisions?: No  Permission Sought/Granted                  Emotional Assessment Appearance:: Appears stated age Attitude/Demeanor/Rapport: Engaged Affect  (typically observed): Appropriate Orientation: : Oriented to Self, Oriented to Place, Oriented to  Time, Oriented to Situation Alcohol / Substance Use: Not Applicable Psych Involvement: No (comment)  Admission diagnosis:  NSTEMI (non-ST elevated myocardial infarction) [facility]) [I21.4] Patient Active Problem List   Diagnosis Date Noted   Angina pectoris (HCC) 10/16/2023   Primary hypertension 10/16/2023   NSTEMI (non-ST elevated myocardial infarction) (HCC) 10/15/2023   Headache 11/30/2018   Plantar fasciitis of right foot 12/29/2015   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 [place] [address] Phone: [phone] Fax: [phone]  [facility] [place], [place] - [address] [place] [address] Phone: [phone] Fax: [phone]     Social Determinants of Health (SDOH) Social History: SDOH Screenings   Food Insecurity: No Food Insecurity (10/15/2023)  Housing: Low Risk  (10/15/2023)  Transportation Needs: No Transportation Needs (10/15/2023)  Utilities: Not At Risk (10/15/2023)  Tobacco Use: Low Risk  (10/15/2023)   SDOH Interventions:     Readmission Risk Interventions     No data to display

## 2023-10-17 NOTE — Progress Notes (Addendum)
   Patient had what sounds like a vasovagal event this afternoon around 12:30pm. He had been sitting up eating lunch for about 3 minutes when he all of a sudden became lightheaded and a little sweaty. His systolic BP dropped to the 80s and his heart rates dropped to the 40s. Looked at telemetry and patient gradually went into sinus bradycardia with rates as low as the 40s and then rates gradually improved back to the 60s to 70s which is his baseline after a few minutes. BP also improved after a few minutes and symptoms resolved. I went by to see patient and he was feeling back to his usual and was asymptomatic. Our plan was to see how he did the rest of the afternoon before deciding on whether he could be discharged or not.  I called to check on patient around 4:30pm and patient just started having very mild 2/10 chest pain after rushing to get out of bed to use the restroom. RN states he looks comfortable. We will add Imdur 30mg  daily given his residual disease and keep him overnight. Asked RN to let us know if pain worsens or persists.  Of note, I did go ahead and send Brilinta to [facility] pharmacy. I will call and let them know that patient is staying the night.  Corrin Parker, PA-C 10/17/2023 4:40 PM

## 2023-10-17 NOTE — Progress Notes (Signed)
While sitting up to eat his lunch, patient reported feeling faint. Cardiac rehab was at bedside doing education. Primary RN assessed patient; HR in 40s and BP 88/58. EKG obtained, cardiology notified. Patient recovered after about 5 minutes; HR improved to 69 and BP improved to 103/70.

## 2023-10-17 NOTE — TOC Benefit Eligibility Note (Signed)
Patient Product/process development scientist completed.    The patient is insured through [employer]. Patient has [employer], may use a copay card, and/or apply for patient assistance if available.    Ran test claim for Brilinta 90 mg and the current 30 day co-pay is $40.00.   This test claim was processed through [facility]- copay amounts may vary at other pharmacies due to pharmacy/plan contracts, or as the patient moves through the different stages of their insurance plan.     Roland Earl, CPHT Pharmacy Technician III Certified Patient Advocate [facility] Pharmacy Patient Advocate Team Direct Number: [phone]  Fax: [phone]

## 2023-10-17 NOTE — Interval H&P Note (Signed)
History and Physical Interval Note:  10/17/2023 9:52 AM  Colon Flattery  has presented today for surgery, with the diagnosis of NSTEMI.  The various methods of treatment have been discussed with the patient and family. After consideration of risks, benefits and other options for treatment, the patient has consented to  Procedure(s): LEFT HEART CATH AND CORONARY ANGIOGRAPHY (N/A) as a surgical intervention.  The patient's history has been reviewed, patient examined, no change in status, stable for surgery.  I have reviewed the patient's chart and labs.  Questions were answered to the patient's satisfaction.    Cath Lab Visit (complete for each Cath Lab visit)  Clinical Evaluation Leading to the Procedure:   ACS: Yes.      Kent Floyd

## 2023-10-17 NOTE — Progress Notes (Addendum)
Rounding Note    Patient Name: Kent Floyd Date of Encounter: 10/17/2023  [facility] Cardiologist: Reatha Harps, MD   Subjective   No significant events overnight. He reports some occasional mild chest pain if he is moving around but current chest pain free. No shortness of breath.  Inpatient Medications    Scheduled Meds:  aspirin EC  81 mg Oral Daily   atorvastatin  40 mg Oral Daily   carvedilol  12.5 mg Oral BID WC   Continuous Infusions:  sodium chloride     heparin 1,250 Units/hr (10/17/23 0408)   PRN Meds: acetaminophen, nitroGLYCERIN, ondansetron (ZOFRAN) IV   Vital Signs    Vitals:   10/16/23 1724 10/16/23 2004 10/17/23 0051 10/17/23 0400  BP: 125/84 126/82 124/74 126/84  Pulse: 72  69 65  Resp: 15 14 16 16   Temp: 99.1 F (37.3 C) 98.7 F (37.1 C) 98.3 F (36.8 C) 98.5 F (36.9 C)  TempSrc: Oral Oral Oral Oral  SpO2:      Weight:      Height:        Intake/Output Summary (Last 24 hours) at 10/17/2023 0920 Last data filed at 10/17/2023 0408 Gross per 24 hour  Intake 287.6 ml  Output --  Net 287.6 ml      10/15/2023    6:50 PM 12/04/2020    9:07 AM 11/28/2020   11:08 AM  Last 3 Weights  Weight (lbs) 224 lb 13.9 oz 217 lb 13 oz 215 lb  Weight (kg) 102 kg 98.8 kg 97.523 kg      Telemetry    Normal sinus rhythm with rates in the 60s to 80s. - Personally Reviewed  ECG    No new ECG tracing today. - Personally Reviewed  Physical Exam   GEN: No acute distress.   Neck: No JVD. Cardiac: RRR. no murmurs, rubs, or gallops.  Respiratory: Clear to auscultation bilaterally. No wheezes, rhonchi, or rales. GI: Soft, non-distended, and non-tender. MS: No lower extremity edema. No deformity. Skin: Warm and dry. Neuro:  No focal deficits. Psych: Normal affect. Responds appropriately.  Labs    High Sensitivity Troponin:  No results for input(s): "TROPONINIHS" in the last 720 hours.   Chemistry Recent Labs  Lab  10/16/23 0437 10/17/23 0645  NA 139 139  K 3.6 4.0  CL 105 109  CO2 24 22  GLUCOSE 115* 106*  BUN 8 9  CREATININE 0.95 0.94  CALCIUM 8.5* 8.4*  GFRNONAA >60 >60  ANIONGAP 10 8    Lipids  Recent Labs  Lab 10/16/23 0437  CHOL 163  TRIG 66  HDL 53  LDLCALC 97  CHOLHDL 3.1    Hematology Recent Labs  Lab 10/16/23 0437 10/17/23 0645  WBC 6.3 5.9  RBC 5.31 5.12  HGB 15.2 14.9  HCT 45.2 44.5  MCV 85.1 86.9  MCH 28.6 29.1  MCHC 33.6 33.5  RDW 12.7 12.8  PLT 254 244   Thyroid  Recent Labs  Lab 10/17/23 0645  TSH 4.531*    BNPNo results for input(s): "BNP", "PROBNP" in the last 168 hours.  DDimer No results for input(s): "DDIMER" in the last 168 hours.   Radiology    ECHOCARDIOGRAM COMPLETE  Result Date: 10/16/2023    ECHOCARDIOGRAM REPORT   Patient Name:   Kent Floyd Date of Exam: 10/16/2023 Medical Rec #:  [id number]       Height:       74.0 in Accession #:  [id number]      Weight:       224.9 lb Date of Birth:  05-Dec-1971       BSA:          2.284 m Patient Age:    52 years        BP:           139/86 mmHg Patient Gender: M               HR:           76 bpm. Exam Location:  Inpatient Procedure: 2D Echo, Cardiac Doppler and Color Doppler Indications:    NSTEMI I21.4  History:        Patient has no prior history of Echocardiogram examinations.                 Signs/Symptoms:Chest Pain; Risk Factors:Hypertension and                 Non-Smoker.  Sonographer:    Dondra Prader RVT RCS Referring Phys: [id number] KAMAL H HENDERSON IMPRESSIONS  1. Left ventricular ejection fraction, by estimation, is 60 to 65%. The left ventricle has normal function. The left ventricle has no regional wall motion abnormalities. There is mild left ventricular hypertrophy. Left ventricular diastolic parameters were normal.  2. Right ventricular systolic function is normal. The right ventricular size is normal. Tricuspid regurgitation signal is inadequate for assessing PA pressure.  3. The mitral  valve is normal in structure. No evidence of mitral valve regurgitation.  4. The aortic valve is tricuspid. Aortic valve regurgitation is not visualized. No aortic stenosis is present.  5. The inferior vena cava is normal in size with greater than 50% respiratory variability, suggesting right atrial pressure of 3 mmHg. FINDINGS  Left Ventricle: Left ventricular ejection fraction, by estimation, is 60 to 65%. The left ventricle has normal function. The left ventricle has no regional wall motion abnormalities. The left ventricular internal cavity size was normal in size. There is  mild left ventricular hypertrophy. Left ventricular diastolic parameters were normal. Right Ventricle: The right ventricular size is normal. No increase in right ventricular wall thickness. Right ventricular systolic function is normal. Tricuspid regurgitation signal is inadequate for assessing PA pressure. Left Atrium: Left atrial size was normal in size. Right Atrium: Right atrial size was normal in size. Pericardium: There is no evidence of pericardial effusion. Mitral Valve: The mitral valve is normal in structure. No evidence of mitral valve regurgitation. Tricuspid Valve: The tricuspid valve is normal in structure. Tricuspid valve regurgitation is trivial. Aortic Valve: The aortic valve is tricuspid. Aortic valve regurgitation is not visualized. No aortic stenosis is present. Aortic valve mean gradient measures 3.0 mmHg. Aortic valve peak gradient measures 5.4 mmHg. Aortic valve area, by VTI measures 3.35 cm. Pulmonic Valve: The pulmonic valve was not well visualized. Pulmonic valve regurgitation is not visualized. Aorta: The aortic root and ascending aorta are structurally normal, with no evidence of dilitation. Venous: The inferior vena cava is normal in size with greater than 50% respiratory variability, suggesting right atrial pressure of 3 mmHg. IAS/Shunts: The interatrial septum was not well visualized.  LEFT VENTRICLE PLAX 2D  LVIDd:         4.70 cm   Diastology LVIDs:         3.10 cm   LV e' medial:    6.74 cm/s LV PW:         1.10 cm   LV E/e' medial:  8.7  LV IVS:        0.70 cm   LV e' lateral:   11.30 cm/s LVOT diam:     2.10 cm   LV E/e' lateral: 5.2 LV SV:         71 LV SV Index:   31 LVOT Area:     3.46 cm  RIGHT VENTRICLE             IVC RV S prime:     12.90 cm/s  IVC diam: 1.40 cm TAPSE (M-mode): 2.4 cm LEFT ATRIUM             Index        RIGHT ATRIUM           Index LA diam:        3.10 cm 1.36 cm/m   RA Area:     14.40 cm LA Vol (A2C):   26.3 ml 11.51 ml/m  RA Volume:   37.70 ml  16.50 ml/m LA Vol (A4C):   26.5 ml 11.60 ml/m LA Biplane Vol: 27.1 ml 11.86 ml/m  AORTIC VALVE                    PULMONIC VALVE AV Area (Vmax):    3.19 cm     PV Vmax:       0.84 m/s AV Area (Vmean):   3.11 cm     PV Peak grad:  2.8 mmHg AV Area (VTI):     3.35 cm AV Vmax:           116.00 cm/s AV Vmean:          77.000 cm/s AV VTI:            0.213 m AV Peak Grad:      5.4 mmHg AV Mean Grad:      3.0 mmHg LVOT Vmax:         107.00 cm/s LVOT Vmean:        69.200 cm/s LVOT VTI:          0.206 m LVOT/AV VTI ratio: 0.97  AORTA Ao Root diam: 2.80 cm Ao Asc diam:  3.00 cm MITRAL VALVE MV Area (PHT): 2.80 cm    SHUNTS MV Decel Time: 271 msec    Systemic VTI:  0.21 m MV E velocity: 58.70 cm/s  Systemic Diam: 2.10 cm MV A velocity: 59.70 cm/s MV E/A ratio:  0.98 Epifanio Lesches MD Electronically signed by Epifanio Lesches MD Signature Date/Time: 10/16/2023/12:27:29 PM    Final     Cardiac Studies   Echocardiogram 10/16/2023: Impressions  1. Left ventricular ejection fraction, by estimation, is 60 to 65%. The  left ventricle has normal function. The left ventricle has no regional  wall motion abnormalities. There is mild left ventricular hypertrophy.  Left ventricular diastolic parameters  were normal.   2. Right ventricular systolic function is normal. The right ventricular  size is normal. Tricuspid regurgitation signal is  inadequate for assessing  PA pressure.   3. The mitral valve is normal in structure. No evidence of mitral valve  regurgitation.   4. The aortic valve is tricuspid. Aortic valve regurgitation is not  visualized. No aortic stenosis is present.   5. The inferior vena cava is normal in size with greater than 50%  respiratory variability, suggesting right atrial pressure of 3 mmHg.   Patient Profile     52 y.o. male with no significant past medical history who presented to [facility] ED  on 10/15/2023 with sudden onset of chest pain with associated lightheadedness and sweating and ruled in for NSTEMI. Therefore, he was transferred to [facility] for cardiac catheterization.   Assessment & Plan    NSTEMI Patient presented with sudden onset of chest pain that he described as a burning sensation on the left side of his chest that radiated to his shoulder blades. He had associated lightheadedness and sweating with this. EKG showed no acute ischemic changes. High-sensitivity troponin elevated at 294 >> 325. Echo showed LVEF of 60-65% with normal wall motion and diastolic function. - Currently chest pain free.  - Continue IV Heparin.  - Continue Aspirin 81mg  daily, Coreg 12.5mg  twice daily, and Lipitor 40mg  daily. - Plan is for LHC today. He was consented for this on 10/27.  Hyperlipidemia Lipid panel this admission: Total Cholesterol 163, Triglycerides 66, HDL 53, LDL 97. LDL goal <70 given CAD.  - Started on Lipitor 40mg  daily.    For questions or updates, please contact [facility] Please consult [email or web address] for contact info under        Signed, Corrin Parker, PA-C  10/17/2023, 9:20 AM      Patient seen and examined. Agree with assessment and plan.  Patient just back from catheterization laboratory.  Cath data reviewed as shown below:    CULPRIT LESION: Prox LAD lesion is 80% stenosed with 80% stenosed side branch in 1st Sept.   A drug-eluting stent was successfully  placed using a SYNERGY XD [id number].  Postdilated to 4.1 mm; Post intervention, there is a 0% residual stenosis and the side branch was preserved at 80% residual stenosis., TIMI-3 flow maintained in both main branch and sidebranches.   Mid LAD lesion is 40% stenosed.   -----------------------------------------------------   Ramus lesion is 50% stenosed.   Prox Cx to Mid (dominant) Cx lesion is 30% stenosed proximal to side branch in 1st Mrg.   -----------------------------------------------------   LV end diastolic pressure is normal.   There is no aortic valve stenosis.   POST CATH DIAGNOSES Left dominant system with severe single-vessel disease-80% stenosis in the proximal LAD (at small 1st Diag/SP1). Successful DES PCI with Synergy DES 3.5 x 16 mm -> post-dilated to 4.1 mm.  (Lesion reduced to 0% with TIMI-3 flow maintained) Otherwise 50% proximal ramus intermedius and 30% proximal dominant LCx disease but no significant stenosis. Normal LVEDP with normal EF by Echo.      RECOMMENDATIONS   In the absence of any other complications or medical issues, we expect the patient to be ready for discharge from an interventional cardiology perspective on 10/17/2023.   Ensure the patient is on appropriate GDMT as tolerated including high-dose statin, beta-blocker, ARB. Assess for other CRF's including DM-2, and elevated LP(a)   Recommend uninterrupted dual antiplatelet therapy with Aspirin 81mg  daily and Ticagrelor 90mg  twice daily for a minimum of 12 months (ACS-Class I recommendation).   For urgent procedures, would be potentially okay to hold DAPT (ASA 81 mg, Brilinta 90 mg twice daily) after 3 months, but but otherwise prefer to at least wait 6 months if possible.  (Patient has pending foot surgery scheduled for November 2024 which will need to be rescheduled.) For bruising would be okay to hold/stop aspirin after 6 months. Given proximal LAD location, would consider continuing second year of SAPT  -monotherapy with either Brilinta 60 mg twice daily or clopidogrel 75 mg daily  Intervention  Cath data reviewed with patient.  Patient underwent PCI to 80% proximal LAD  stenosis DES stenting.  He has concomitant CAD with 50% ramus and 30% proximal circumflex stenosis in addition to 40% mid LAD stenoses.  He is now on carvedilol 6.25 mg twice a day.  Depending upon blood pressure and heart rate response, may require medical therapy if elevated.  He was not on any lipid-lowering therapy and was started on Lipitor 40 mg.  I discussed with him target LDL less than 55.  LP(a) has been checked and is normal at 17.6.  Discussed case with Dr. Herbie Baltimore.  He feels the patient can be discharged later today.  Will tentatively plan discharge this evening around 6 PM if remains stable.      Lennette Bihari, MD, [facility] 10/17/2023 11:53 AM

## 2023-10-18 ENCOUNTER — Encounter (HOSPITAL_COMMUNITY): Payer: Self-pay | Admitting: Cardiology

## 2023-10-18 ENCOUNTER — Other Ambulatory Visit (HOSPITAL_COMMUNITY): Payer: Self-pay

## 2023-10-18 ENCOUNTER — Telehealth: Payer: Self-pay | Admitting: Cardiology

## 2023-10-18 LAB — CBC
HCT: 41.7 % (ref 39.0–52.0)
Hemoglobin: 13.6 g/dL (ref 13.0–17.0)
MCH: 28.3 pg (ref 26.0–34.0)
MCHC: 32.6 g/dL (ref 30.0–36.0)
MCV: 86.9 fL (ref 80.0–100.0)
Platelets: 238 10*3/uL (ref 150–400)
RBC: 4.8 MIL/uL (ref 4.22–5.81)
RDW: 12.8 % (ref 11.5–15.5)
WBC: 8.4 10*3/uL (ref 4.0–10.5)
nRBC: 0 % (ref 0.0–0.2)

## 2023-10-18 LAB — BASIC METABOLIC PANEL
Anion gap: 9 (ref 5–15)
BUN: 10 mg/dL (ref 6–20)
CO2: 25 mmol/L (ref 22–32)
Calcium: 8.8 mg/dL — ABNORMAL LOW (ref 8.9–10.3)
Chloride: 105 mmol/L (ref 98–111)
Creatinine, Ser: 1.07 mg/dL (ref 0.61–1.24)
GFR, Estimated: >60 mL/min (ref >60)
Glucose, Bld: 111 mg/dL — ABNORMAL HIGH (ref 70–99)
Potassium: 3.8 mmol/L (ref 3.5–5.1)
Sodium: 139 mmol/L (ref 135–145)

## 2023-10-18 MED ORDER — ASPIRIN 81 MG PO TBEC
81.0000 mg | DELAYED_RELEASE_TABLET | Freq: Every day | ORAL | 2 refills | Status: DC
  Filled 2023-10-18: qty 30, 30d supply, fill #0

## 2023-10-18 MED ORDER — NITROGLYCERIN 0.4 MG SL SUBL
0.4000 mg | SUBLINGUAL_TABLET | SUBLINGUAL | 2 refills | Status: AC | PRN
  Filled 2023-10-18: qty 25, 8d supply, fill #0

## 2023-10-18 MED ORDER — ISOSORBIDE MONONITRATE ER 30 MG PO TB24
30.0000 mg | ORAL_TABLET | Freq: Every day | ORAL | 0 refills | Status: DC
  Filled 2023-10-18: qty 30, 30d supply, fill #0

## 2023-10-18 MED ORDER — ATORVASTATIN CALCIUM 40 MG PO TABS
40.0000 mg | ORAL_TABLET | Freq: Every day | ORAL | 1 refills | Status: DC
  Filled 2023-10-18: qty 30, 30d supply, fill #0

## 2023-10-18 MED ORDER — CARVEDILOL 12.5 MG PO TABS
12.5000 mg | ORAL_TABLET | Freq: Two times a day (BID) | ORAL | 1 refills | Status: DC
  Filled 2023-10-18: qty 60, 30d supply, fill #0

## 2023-10-18 NOTE — Plan of Care (Signed)
  Problem: Education: Goal: Knowledge of General Education information will improve Description: Including pain rating scale, medication(s)/side effects and non-pharmacologic comfort measures Outcome: Progressing   Problem: Health Behavior/Discharge Planning: Goal: Ability to manage health-related needs will improve Outcome: Progressing   Problem: Clinical Measurements: Goal: Ability to maintain clinical measurements within normal limits will improve Outcome: Progressing Goal: Will remain free from infection Outcome: Progressing Goal: Diagnostic test results will improve Outcome: Progressing Goal: Respiratory complications will improve Outcome: Progressing Goal: Cardiovascular complication will be avoided Outcome: Progressing   Problem: Activity: Goal: Risk for activity intolerance will decrease Outcome: Progressing   Problem: Nutrition: Goal: Adequate nutrition will be maintained Outcome: Progressing   Problem: Coping: Goal: Level of anxiety will decrease Outcome: Progressing   Problem: Elimination: Goal: Will not experience complications related to bowel motility Outcome: Progressing Goal: Will not experience complications related to urinary retention Outcome: Progressing   Problem: Pain Management: Goal: General experience of comfort will improve Outcome: Progressing   Problem: Safety: Goal: Ability to remain free from injury will improve Outcome: Progressing   Problem: Skin Integrity: Goal: Risk for impaired skin integrity will decrease Outcome: Progressing   Problem: Education: Goal: Understanding of cardiac disease, CV risk reduction, and recovery process will improve Outcome: Progressing Goal: Individualized Educational Video(s) Outcome: Progressing   Problem: Activity: Goal: Ability to tolerate increased activity will improve Outcome: Progressing   Problem: Cardiac: Goal: Ability to achieve and maintain adequate cardiovascular perfusion will  improve Outcome: Progressing   Problem: Health Behavior/Discharge Planning: Goal: Ability to safely manage health-related needs after discharge will improve Outcome: Progressing   Problem: Education: Goal: Understanding of CV disease, CV risk reduction, and recovery process will improve Outcome: Progressing Goal: Individualized Educational Video(s) Outcome: Progressing   Problem: Activity: Goal: Ability to return to baseline activity level will improve Outcome: Progressing   Problem: Cardiovascular: Goal: Ability to achieve and maintain adequate cardiovascular perfusion will improve Outcome: Progressing Goal: Vascular access site(s) Level 0-1 will be maintained Outcome: Progressing   Problem: Health Behavior/Discharge Planning: Goal: Ability to safely manage health-related needs after discharge will improve Outcome: Progressing

## 2023-10-18 NOTE — Telephone Encounter (Addendum)
   Transition of Care Follow-up Phone Call Request    Patient Name: Fady Hales Date of Birth: 1971-02-18 Date of Encounter: 10/18/2023  Primary Care Provider:  Darrow Bussing, MD Primary Cardiologist:  Reatha Harps, MD  Colon Flattery has been scheduled for a transition of care follow up appointment with a [facility] provider:  Frutoso Schatz 11/5 (originally made at [facility], but patient request [facility] f/u)  Please reach out to Colon Flattery within 48 hours of discharge to confirm appointment and review transition of care protocol questionnaire. Anticipated discharge date: 10/29  Laverda Page, NP  10/18/2023, 9:21 AM

## 2023-10-18 NOTE — Progress Notes (Signed)
CARDIAC REHAB PHASE I   PRE:  Rate/Rhythm: 78 SR  BP:  Sitting: 129/76    MODE:  Ambulation: 470 ft   POST:  Rate/Rhythm: 86 SR  BP:  Sitting: 103/81    Pt ambulated independently in hallway. Tolerated well with no CP, SOB or dizziness. Returned to be with call bell and bedside table in reach. Post MI/stent education complete. Referral for CRP2 sent to [facility]. Plans for home later today.   [id number]  Woodroe Chen, RN BSN 10/18/2023 10:15 AM

## 2023-10-18 NOTE — Telephone Encounter (Signed)
Patient is currently admitted. Appears to be getting discharged today.

## 2023-10-18 NOTE — Discharge Summary (Addendum)
Discharge Summary    Patient ID: Warden Canete MRN: [id number]; DOB: February 07, 1971  Admit date: 10/15/2023 Discharge date: 10/18/2023  PCP:  Darrow Bussing, MD   [facility] Providers Cardiologist:  Reatha Harps, MD   ( requests to f/u in [place])  Discharge Diagnoses    Principal Problem:   NSTEMI (non-ST elevated myocardial infarction) [facility]) Active Problems:   Angina pectoris [facility])   Primary hypertension  Diagnostic Studies/Procedures    Cath: 10/17/2023    CULPRIT LESION: Prox LAD lesion is 80% stenosed with 80% stenosed side branch in 1st Sept.   A drug-eluting stent was successfully placed using a SYNERGY XD 3.50X16.  Postdilated to 4.1 mm; Post intervention, there is a 0% residual stenosis and the side branch was preserved at 80% residual stenosis., TIMI-3 flow maintained in both main branch and sidebranches.   Mid LAD lesion is 40% stenosed.   -----------------------------------------------------   Ramus lesion is 50% stenosed.   Prox Cx to Mid (dominant) Cx lesion is 30% stenosed proximal to side branch in 1st Mrg.   -----------------------------------------------------   LV end diastolic pressure is normal.   There is no aortic valve stenosis.   POST CATH DIAGNOSES Left dominant system with severe single-vessel disease-80% stenosis in the proximal LAD (at small 1st Diag/SP1). Successful DES PCI with Synergy DES 3.5 x 16 mm -> post-dilated to 4.1 mm.  (Lesion reduced to 0% with TIMI-3 flow maintained) Otherwise 50% proximal ramus intermedius and 30% proximal dominant LCx disease but no significant stenosis. Normal LVEDP with normal EF by Echo.      RECOMMENDATIONS   In the absence of any other complications or medical issues, we expect the patient to be ready for discharge from an interventional cardiology perspective on 10/17/2023.   Ensure the patient is on appropriate GDMT as tolerated including high-dose statin, beta-blocker, ARB. Assess for  other CRF's including DM-2, and elevated LP(a)   Recommend uninterrupted dual antiplatelet therapy with Aspirin 81mg  daily and Ticagrelor 90mg  twice daily for a minimum of 12 months (ACS-Class I recommendation).   For urgent procedures, would be potentially okay to hold DAPT (ASA 81 mg, Brilinta 90 mg twice daily) after 3 months, but but otherwise prefer to at least wait 6 months if possible.  (Patient has pending foot surgery scheduled for November 2024 which will need to be rescheduled.) For bruising would be okay to hold/stop aspirin after 6 months. Given proximal LAD location, would consider continuing second year of SAPT -monotherapy with either Brilinta 60 mg twice daily or clopidogrel 75 mg daily    Bryan Lemma, MD  Diagnostic Dominance: Left  Intervention   Echo: 10/16/2023   IMPRESSIONS     1. Left ventricular ejection fraction, by estimation, is 60 to 65%. The  left ventricle has normal function. The left ventricle has no regional  wall motion abnormalities. There is mild left ventricular hypertrophy.  Left ventricular diastolic parameters  were normal.   2. Right ventricular systolic function is normal. The right ventricular  size is normal. Tricuspid regurgitation signal is inadequate for assessing  PA pressure.   3. The mitral valve is normal in structure. No evidence of mitral valve  regurgitation.   4. The aortic valve is tricuspid. Aortic valve regurgitation is not  visualized. No aortic stenosis is present.   5. The inferior vena cava is normal in size with greater than 50%  respiratory variability, suggesting right atrial pressure of 3 mmHg.   FINDINGS   Left Ventricle:  Left ventricular ejection fraction, by estimation, is 60  to 65%. The left ventricle has normal function. The left ventricle has no  regional wall motion abnormalities. The left ventricular internal cavity  size was normal in size. There is   mild left ventricular hypertrophy. Left ventricular  diastolic parameters  were normal.   Right Ventricle: The right ventricular size is normal. No increase in  right ventricular wall thickness. Right ventricular systolic function is  normal. Tricuspid regurgitation signal is inadequate for assessing PA  pressure.   Left Atrium: Left atrial size was normal in size.   Right Atrium: Right atrial size was normal in size.   Pericardium: There is no evidence of pericardial effusion.   Mitral Valve: The mitral valve is normal in structure. No evidence of  mitral valve regurgitation.   Tricuspid Valve: The tricuspid valve is normal in structure. Tricuspid  valve regurgitation is trivial.   Aortic Valve: The aortic valve is tricuspid. Aortic valve regurgitation is  not visualized. No aortic stenosis is present. Aortic valve mean gradient  measures 3.0 mmHg. Aortic valve peak gradient measures 5.4 mmHg. Aortic  valve area, by VTI measures 3.35  cm.   Pulmonic Valve: The pulmonic valve was not well visualized. Pulmonic valve  regurgitation is not visualized.   Aorta: The aortic root and ascending aorta are structurally normal, with  no evidence of dilitation.   Venous: The inferior vena cava is normal in size with greater than 50%  respiratory variability, suggesting right atrial pressure of 3 mmHg.   IAS/Shunts: The interatrial septum was not well visualized.  _____________   History of Present Illness     Elise Loris is a 52 y.o. male with no reported PMH who presented with chest pain and found to have a NSTEMI. Mr. Sepe states earlier today at around 10 am while walking at the [facility] this morning he suddenly noticed a burning sensation at his left chest that went to his should blades.  He soon noticed some sweating and lightheadedness that lasting a few minutes and resolved.  Patient states that he [place] emergency medical services [place] the festival for which he was given aspirin and had an ECG.  He was not  brought to the emergency room.  Due to ongoing chest pain, he decided to go to his local emergency department for further evaluation.  He states a similar episode of chest pain occurred last week while at work and last a few minutes.  He has had in total 3 different episodes of chest pain in the last few months.  He is now reporting 2/10 chest pain.  Patient states that he is being seen by a primary care provider on a regular basis and only has 'borderline' hypertension for which he has been monitored for.    At [facility], initial HS-troponin was 294 ng/L and went up to 325 ng/L.  Chest CT angiography was performed and ruled out pulmonary embolism.  Based on note documentation, ECG was without any signs of ischemia or infarct.  Patient was given high-dose aspirin and initiated on heparin drip with initial bolus.  Patient was accepted to our service by Dr. Lewayne Bunting.    Hospital Course     NSTEMI -- High-sensitivity troponin up to 325.  Underwent cardiac catheterization noted above with successful PCI/DES to 80% stenosis of proximal LAD.  50% proximal ramus and 30% proximal circumflex to be treated medically.  Placed on DAPT with aspirin/Brilinta for  at least 1 year.  Initial plan was to DC home after cardiac catheterization but patient had a vasovagal episode.  Also had recurrent chest pain and Imdur 30 mg daily was added.  Echocardiogram showed an LVEF of 60 to 65%, no regional wall motion abnormality, mild LVH, normal RV. Seen by CR and ambulated without recurrent chest pain. -- Continue aspirin, Brilinta, atorvastatin 40 mg daily, carvedilol 12.5 mg twice daily  HLD -- LDL 97, HDL 53 -- Continue atorvastatin 40 mg daily -- Need LFT/FLP in 8 weeks  General: Well developed, well nourished, male appearing in no acute distress. Head: Normocephalic, atraumatic.  Neck: Supple without bruits, JVD. Lungs:  Resp regular and unlabored, CTA. Heart: RRR, S1, S2, no S3, S4, or  murmur; no rub. Abdomen: Soft, non-tender, non-distended with normoactive bowel sounds. No hepatomegaly. No rebound/guarding. No obvious abdominal masses. Extremities: No clubbing, cyanosis, edema. Distal pedal pulses are 2+ bilaterally. Right cath site stable without bruising or hematoma Neuro: Alert and oriented X 3. Moves all extremities spontaneously. Psych: Normal affect.  Did the patient have an acute coronary syndrome (MI, NSTEMI, STEMI, etc) this admission?:  Yes                               AHA/ACC ACS Clinical Performance & Quality Measures: Aspirin prescribed? - Yes ADP Receptor Inhibitor (Plavix/Clopidogrel, Brilinta/Ticagrelor or Effient/Prasugrel) prescribed (includes medically managed patients)? - Yes Beta Blocker prescribed? - Yes High Intensity Statin (Lipitor 40-80mg  or Crestor 20-40mg ) prescribed? - Yes EF assessed during THIS hospitalization? - Yes For EF <40%, was ACEI/ARB prescribed? - Not Applicable (EF >/= 40%) For EF <40%, Aldosterone Antagonist (Spironolactone or Eplerenone) prescribed? - Not Applicable (EF >/= 40%) Cardiac Rehab Phase II ordered (including medically managed patients)? - Yes     The patient will be scheduled for a TOC follow up appointment in 10-14 days.  A message has been sent to the [facility] and Scheduling Pool at the office where the patient should be seen for follow up.  _____________  Discharge Vitals Blood pressure 127/67, pulse 68, temperature 98.1 F (36.7 C), temperature source Oral, resp. rate 19, height 6\' 2"  (1.88 m), weight 102 kg, SpO2 100%.  Filed Weights   10/15/23 1850  Weight: 102 kg    Labs & Radiologic Studies    CBC Recent Labs    10/17/23 0645 10/18/23 0441  WBC 5.9 8.4  HGB 14.9 13.6  HCT 44.5 41.7  MCV 86.9 86.9  PLT 244 238   Basic Metabolic Panel Recent Labs    62/69/48 0645 10/18/23 0441  NA 139 139  K 4.0 3.8  CL 109 105  CO2 22 25  GLUCOSE 106* 111*  BUN 9 10  CREATININE 0.94 1.07  CALCIUM  8.4* 8.8*   Liver Function Tests No results for input(s): "AST", "ALT", "ALKPHOS", "BILITOT", "PROT", "ALBUMIN" in the last 72 hours. No results for input(s): "LIPASE", "AMYLASE" in the last 72 hours. High Sensitivity Troponin:   No results for input(s): "TROPONINIHS" in the last 720 hours.  BNP Invalid input(s): "POCBNP" D-Dimer No results for input(s): "DDIMER" in the last 72 hours. Hemoglobin A1C Recent Labs    10/17/23 0645  HGBA1C 5.6   Fasting Lipid Panel Recent Labs    10/16/23 0437  CHOL 163  HDL 53  LDLCALC 97  TRIG 66  CHOLHDL 3.1   Thyroid Function Tests Recent Labs    10/17/23 0645  TSH  4.531*   _____________  CARDIAC CATHETERIZATION  Result Date: 10/17/2023   CULPRIT LESION: Prox LAD lesion is 80% stenosed with 80% stenosed side branch in 1st Sept.   A drug-eluting stent was successfully placed using a SYNERGY XD 3.50X16.  Postdilated to 4.1 mm; Post intervention, there is a 0% residual stenosis and the side branch was preserved at 80% residual stenosis., TIMI-3 flow maintained in both main branch and sidebranches.   Mid LAD lesion is 40% stenosed.   -----------------------------------------------------   Ramus lesion is 50% stenosed.   Prox Cx to Mid (dominant) Cx lesion is 30% stenosed proximal to side branch in 1st Mrg.   -----------------------------------------------------   LV end diastolic pressure is normal.   There is no aortic valve stenosis. POST CATH DIAGNOSES Left dominant system with severe single-vessel disease-80% stenosis in the proximal LAD (at small 1st Diag/SP1). Successful DES PCI with Synergy DES 3.5 x 16 mm -> post-dilated to 4.1 mm.  (Lesion reduced to 0% with TIMI-3 flow maintained) Otherwise 50% proximal ramus intermedius and 30% proximal dominant LCx disease but no significant stenosis. Normal LVEDP with normal EF by Echo. RECOMMENDATIONS   In the absence of any other complications or medical issues, we expect the patient to be ready for  discharge from an interventional cardiology perspective on 10/17/2023.   Ensure the patient is on appropriate GDMT as tolerated including high-dose statin, beta-blocker, ARB. Assess for other CRF's including DM-2, and elevated LP(a)   Recommend uninterrupted dual antiplatelet therapy with Aspirin 81mg  daily and Ticagrelor 90mg  twice daily for a minimum of 12 months (ACS-Class I recommendation).   For urgent procedures, would be potentially okay to hold DAPT (ASA 81 mg, Brilinta 90 mg twice daily) after 3 months, but but otherwise prefer to at least wait 6 months if possible.  (Patient has pending foot surgery scheduled for November 2024 which will need to be rescheduled.) For bruising would be okay to hold/stop aspirin after 6 months. Given proximal LAD location, would consider continuing second year of SAPT -monotherapy with either Brilinta 60 mg twice daily or clopidogrel 75 mg daily Bryan Lemma, MD  ECHOCARDIOGRAM COMPLETE  Result Date: 10/16/2023    ECHOCARDIOGRAM REPORT   Patient Name:   DAMORIAN ROZZI Date of Exam: 10/16/2023 Medical Rec #:  [id number]       Height:       74.0 in Accession #:    [id number]      Weight:       224.9 lb Date of Birth:  [birth year]       BSA:          2.284 m Patient Age:    52 years        BP:           139/86 mmHg Patient Gender: M               HR:           76 bpm. Exam Location:  Inpatient Procedure: 2D Echo, Cardiac Doppler and Color Doppler Indications:    NSTEMI I21.4  History:        Patient has no prior history of Echocardiogram examinations.                 Signs/Symptoms:Chest Pain; Risk Factors:Hypertension and                 Non-Smoker.  Sonographer:    Dondra Prader RVT RCS Referring Phys: [id number] KAMAL H HENDERSON IMPRESSIONS  1. Left  ventricular ejection fraction, by estimation, is 60 to 65%. The left ventricle has normal function. The left ventricle has no regional wall motion abnormalities. There is mild left ventricular hypertrophy. Left ventricular  diastolic parameters were normal.  2. Right ventricular systolic function is normal. The right ventricular size is normal. Tricuspid regurgitation signal is inadequate for assessing PA pressure.  3. The mitral valve is normal in structure. No evidence of mitral valve regurgitation.  4. The aortic valve is tricuspid. Aortic valve regurgitation is not visualized. No aortic stenosis is present.  5. The inferior vena cava is normal in size with greater than 50% respiratory variability, suggesting right atrial pressure of 3 mmHg. FINDINGS  Left Ventricle: Left ventricular ejection fraction, by estimation, is 60 to 65%. The left ventricle has normal function. The left ventricle has no regional wall motion abnormalities. The left ventricular internal cavity size was normal in size. There is  mild left ventricular hypertrophy. Left ventricular diastolic parameters were normal. Right Ventricle: The right ventricular size is normal. No increase in right ventricular wall thickness. Right ventricular systolic function is normal. Tricuspid regurgitation signal is inadequate for assessing PA pressure. Left Atrium: Left atrial size was normal in size. Right Atrium: Right atrial size was normal in size. Pericardium: There is no evidence of pericardial effusion. Mitral Valve: The mitral valve is normal in structure. No evidence of mitral valve regurgitation. Tricuspid Valve: The tricuspid valve is normal in structure. Tricuspid valve regurgitation is trivial. Aortic Valve: The aortic valve is tricuspid. Aortic valve regurgitation is not visualized. No aortic stenosis is present. Aortic valve mean gradient measures 3.0 mmHg. Aortic valve peak gradient measures 5.4 mmHg. Aortic valve area, by VTI measures 3.35 cm. Pulmonic Valve: The pulmonic valve was not well visualized. Pulmonic valve regurgitation is not visualized. Aorta: The aortic root and ascending aorta are structurally normal, with no evidence of dilitation. Venous: The  inferior vena cava is normal in size with greater than 50% respiratory variability, suggesting right atrial pressure of 3 mmHg. IAS/Shunts: The interatrial septum was not well visualized.  LEFT VENTRICLE PLAX 2D LVIDd:         4.70 cm   Diastology LVIDs:         3.10 cm   LV e' medial:    6.74 cm/s LV PW:         1.10 cm   LV E/e' medial:  8.7 LV IVS:        0.70 cm   LV e' lateral:   11.30 cm/s LVOT diam:     2.10 cm   LV E/e' lateral: 5.2 LV SV:         71 LV SV Index:   31 LVOT Area:     3.46 cm  RIGHT VENTRICLE             IVC RV S prime:     12.90 cm/s  IVC diam: 1.40 cm TAPSE (M-mode): 2.4 cm LEFT ATRIUM             Index        RIGHT ATRIUM           Index LA diam:        3.10 cm 1.36 cm/m   RA Area:     14.40 cm LA Vol (A2C):   26.3 ml 11.51 ml/m  RA Volume:   37.70 ml  16.50 ml/m LA Vol (A4C):   26.5 ml 11.60 ml/m LA Biplane Vol: 27.1 ml 11.86 ml/m  AORTIC VALVE                    PULMONIC VALVE AV Area (Vmax):    3.19 cm     PV Vmax:       0.84 m/s AV Area (Vmean):   3.11 cm     PV Peak grad:  2.8 mmHg AV Area (VTI):     3.35 cm AV Vmax:           116.00 cm/s AV Vmean:          77.000 cm/s AV VTI:            0.213 m AV Peak Grad:      5.4 mmHg AV Mean Grad:      3.0 mmHg LVOT Vmax:         107.00 cm/s LVOT Vmean:        69.200 cm/s LVOT VTI:          0.206 m LVOT/AV VTI ratio: 0.97  AORTA Ao Root diam: 2.80 cm Ao Asc diam:  3.00 cm MITRAL VALVE MV Area (PHT): 2.80 cm    SHUNTS MV Decel Time: 271 msec    Systemic VTI:  0.21 m MV E velocity: 58.70 cm/s  Systemic Diam: 2.10 cm MV A velocity: 59.70 cm/s MV E/A ratio:  0.98 Epifanio Lesches MD Electronically signed by Epifanio Lesches MD Signature Date/Time: 10/16/2023/12:27:29 PM    Final    Disposition   Pt is being discharged home today in good condition.  Follow-up Plans & Appointments     Discharge Instructions     Amb Referral to Cardiac Rehabilitation   Complete by: As directed    Diagnosis:  Coronary Stents NSTEMI      After initial evaluation and assessments completed: Virtual Based Care may be provided alone or in conjunction with Phase 2 Cardiac Rehab based on patient barriers.: Yes   Intensive Cardiac Rehabilitation (ICR) [facility] location only OR Traditional Cardiac Rehabilitation (TCR) *If criteria for ICR are not met will enroll in TCR [facility] only): Yes        Discharge Medications   Allergies as of 10/18/2023       Reactions   Tree Extract Other (See Comments)   Throat itching        Medication List     TAKE these medications    aspirin EC 81 MG tablet Take 1 tablet (81 mg total) by mouth daily. Swallow whole. Start taking on: October 19, 2023   atorvastatin 40 MG tablet Commonly known as: LIPITOR Take 1 tablet (40 mg total) by mouth daily. Start taking on: October 19, 2023   carvedilol 12.5 MG tablet Commonly known as: COREG Take 1 tablet (12.5 mg total) by mouth 2 (two) times daily with a meal.   isosorbide mononitrate 30 MG 24 hr tablet Commonly known as: IMDUR Take 1 tablet (30 mg total) by mouth daily. Start taking on: October 19, 2023   nitroGLYCERIN 0.4 MG SL tablet Commonly known as: NITROSTAT Place 1 tablet (0.4 mg total) under the tongue every 5 (five) minutes x 3 doses as needed for chest pain.   ticagrelor 90 MG Tabs tablet Commonly known as: BRILINTA Take 1 tablet (90 mg total) by mouth 2 (two) times daily.        Outstanding Labs/Studies   FLP/LFTs in 8 weeks   Duration of Discharge Encounter   Greater than 30 minutes including physician time.  Signed, Laverda Page, NP 10/18/2023, 11:45 AM  Patient seen and examined. Agree with assessment and plan.  Patient feels well without recurrent chest pain or any further vasovagal episode.  Telemetry reveals sinus rhythm in the 70s.  Plan for discharge today with aspirin/Brilinta, he is now on low-dose isosorbide in addition to carvedilol with his concomitant CAD and is on atorvastatin for lipid  management.  LP(a) is pending.  The patient lives in the [place] area.  He would like to be followed in [place].  We discussed follow-up with Dr. Thayer Ohm End or Dr. Mariah Milling.  Plan discharge today.   Lennette Bihari, MD, [facility] 10/18/2023 11:45 AM

## 2023-10-18 NOTE — Telephone Encounter (Signed)
LMTCB

## 2023-10-20 ENCOUNTER — Encounter: Payer: Self-pay | Admitting: Cardiology

## 2023-10-21 NOTE — Telephone Encounter (Signed)
LMTCB

## 2023-10-24 NOTE — Telephone Encounter (Signed)
Spoke with patient and confirmed appointment scheduled for tomorrow. He verbalized understanding with no further questions at this time.

## 2023-10-25 ENCOUNTER — Encounter: Payer: Self-pay | Admitting: Cardiology

## 2023-10-25 ENCOUNTER — Ambulatory Visit: Payer: Managed Care, Other (non HMO) | Admitting: General Practice

## 2023-10-25 ENCOUNTER — Ambulatory Visit: Payer: Managed Care, Other (non HMO) | Attending: Cardiology | Admitting: Cardiology

## 2023-10-25 VITALS — BP 90/68 | HR 70 | Ht 74.0 in | Wt 223.8 lb

## 2023-10-25 DIAGNOSIS — E782 Mixed hyperlipidemia: Secondary | ICD-10-CM | POA: Diagnosis not present

## 2023-10-25 DIAGNOSIS — I25118 Atherosclerotic heart disease of native coronary artery with other forms of angina pectoris: Secondary | ICD-10-CM

## 2023-10-25 DIAGNOSIS — I214 Non-ST elevation (NSTEMI) myocardial infarction: Secondary | ICD-10-CM

## 2023-10-25 DIAGNOSIS — I10 Essential (primary) hypertension: Secondary | ICD-10-CM | POA: Diagnosis not present

## 2023-10-25 DIAGNOSIS — R55 Syncope and collapse: Secondary | ICD-10-CM

## 2023-10-25 MED ORDER — ISOSORBIDE MONONITRATE ER 30 MG PO TB24: 15.0000 mg | ORAL_TABLET | Freq: Every day | ORAL | 3 refills | Status: DC

## 2023-10-25 NOTE — Progress Notes (Signed)
Cardiology Office Note:  .   Date:  10/25/2023  ID:  Kent Floyd, DOB 09/22/1971, MRN [id number] PCP: Darrow Bussing, MD  [facility] Providers Cardiologist:  Reatha Harps, MD    History of Present Illness: .   Kent Floyd is a 52 y.o. male coronary artery disease status post NSTEMI (10/17/2023), hypertension, hyperlipidemia, who is here today for hospital follow-up.  He presented to [facility] emergency department on 10/15/2023 with additional high-sensitivity troponin of 294 that increased to 325.  CTA of the chest was performed to rule out pulmonary embolism.  [facility], ECG was without any signs of ischemia or infarct.  Patient was given high-dose aspirin and initiated on heparin infusion after bolus.  He was subsequently transferred to [facility].  He stated that his symptoms started around 10 AM [place] he suddenly noticed a burning sensation on the left side of his chest and went into his shoulder blades.  He said had associated symptoms of sweating and lightheadedness that lasted a few minutes then resolved.  He states he been evaluated [place] emergency medical services [place] the festival for which she was given aspirin and an ECG.  He did not bring that to the emergency department on his arrival.  Due to ongoing chest pain he send again his local emergency department for further evaluation.  He states a similar episode of chest pain occurred last week while at work that lasted a few minutes.  He said a total of 3 different episodes of chest discomfort in the last few months.  States that he has been seen by his primary care provider on a regular basis for borderline hypertension which she is being monitored for.  Initial blood pressure was 163/97, pulse 79, respirations 17, temperature 98.2.  CBC was unremarkable.  BMP showed elevated glucose.  Lipid panel revealed LDL of 97.  Electrolyte resulted 17.6.  He was  continued on heparin infusion and was taken to the cardiac Cath Lab on 10/17/2023 for diagnostic heart catheterization which revealed the culprit lesion is a proximal LAD lesion that was 80% stenosis where he went successful placement of DES.  Mid LAD lesion was 40%, ramus lesion was 50% stenosed, proximal circumflex to mid circumflex lesion 30% stenosis of the proximal sidebranch of the first marginal, LV end-diastolic pressure was normal, there was no aortic valve stenosis.  Recommendations were to continue uninterrupted DAPT with aspirin and Brilinta for minimum of 12 months.  Echocardiogram revealed an LVEF of 60 to 65%, mild LVH, and no valvular abnormalities.  Hospitalization was complicated by vasovagal reaction post left heart catheterization which required an extended hospital stay.  He was continued on appropriate medication regimen and was subsequently considered stable for discharge 10/18/2023.  He returns to clinic today stating that overall he has been doing well.  Unfortunately he has had another episode of a vasovagal event.  He has been compliant with his medications.  Denies any bleeding or blood noted in his urine or stool.  Has not had any syncopal or near syncopal episodes.  Denies any chest pain or shortness of breath.  Has been trying to stay active.  He has yet to hear from cardiac rehab.  Several questions today related to bruising, bleeding, and medications he can take with his current cardiac medications.  ROS: 10 point review of systems has been reviewed and is considered negative with exception what is been listed in the  HPI  Studies Reviewed: Marland Kitchen   EKG Interpretation Date/Time:  Tuesday October 25 2023 11:00:55 EST Ventricular Rate:  70 PR Interval:  142 QRS Duration:  86 QT Interval:  356 QTC Calculation: 384 R Axis:   25  Text Interpretation: Normal sinus rhythm Normal ECG When compared with ECG of 18-Oct-2023 06:22, Questionable change in QRS axis Confirmed by Charlsie Quest ([id number]) on 10/25/2023 11:09:50 AM    Cath: 10/17/2023    CULPRIT LESION: Prox LAD lesion is 80% stenosed with 80% stenosed side branch in 1st Sept.   A drug-eluting stent was successfully placed using a SYNERGY XD 3.50X16.  Postdilated to 4.1 mm; Post intervention, there is a 0% residual stenosis and the side branch was preserved at 80% residual stenosis., TIMI-3 flow maintained in both main branch and sidebranches.   Mid LAD lesion is 40% stenosed.   -----------------------------------------------------   Ramus lesion is 50% stenosed.   Prox Cx to Mid (dominant) Cx lesion is 30% stenosed proximal to side branch in 1st Mrg.   -----------------------------------------------------   LV end diastolic pressure is normal.   There is no aortic valve stenosis.   POST CATH DIAGNOSES Left dominant system with severe single-vessel disease-80% stenosis in the proximal LAD (at small 1st Diag/SP1). Successful DES PCI with Synergy DES 3.5 x 16 mm -> post-dilated to 4.1 mm.  (Lesion reduced to 0% with TIMI-3 flow maintained) Otherwise 50% proximal ramus intermedius and 30% proximal dominant LCx disease but no significant stenosis. Normal LVEDP with normal EF by Echo.  RECOMMENDATIONS   In the absence of any other complications or medical issues, we expect the patient to be ready for discharge from an interventional cardiology perspective on 10/17/2023.   Ensure the patient is on appropriate GDMT as tolerated including high-dose statin, beta-blocker, ARB. Assess for other CRF's including DM-2, and elevated LP(a)   Recommend uninterrupted dual antiplatelet therapy with Aspirin 81mg  daily and Ticagrelor 90mg  twice daily for a minimum of 12 months (ACS-Class I recommendation).   For urgent procedures, would be potentially okay to hold DAPT (ASA 81 mg, Brilinta 90 mg twice daily) after 3 months, but but otherwise prefer to at least wait 6 months if possible.  (Patient has pending foot surgery scheduled for  November 2024 which will need to be rescheduled.) For bruising would be okay to hold/stop aspirin after 6 months. Given proximal LAD location, would consider continuing second year of SAPT -monotherapy with either Brilinta 60 mg twice daily or clopidogrel 75 mg daily    Bryan Lemma, MD   Diagnostic Dominance: Left  Intervention   Echo: 10/16/2023  1. Left ventricular ejection fraction, by estimation, is 60 to 65%. The  left ventricle has normal function. The left ventricle has no regional  wall motion abnormalities. There is mild left ventricular hypertrophy.  Left ventricular diastolic parameters  were normal.   2. Right ventricular systolic function is normal. The right ventricular  size is normal. Tricuspid regurgitation signal is inadequate for assessing  PA pressure.   3. The mitral valve is normal in structure. No evidence of mitral valve  regurgitation.   4. The aortic valve is tricuspid. Aortic valve regurgitation is not  visualized. No aortic stenosis is present.   5. The inferior vena cava is normal in size with greater than 50%  respiratory variability, suggesting right atrial pressure of 3 mmHg.  Risk Assessment/Calculations:             Physical Exam:   VS:  BP  90/68 (BP Location: Left Arm, Patient Position: Sitting, Cuff Size: Normal)   Pulse 70   Ht 6\' 2"  (1.88 m)   Wt 223 lb 12.8 oz (101.5 kg)   SpO2 94%   BMI 28.73 kg/m    Wt Readings from Last 3 Encounters:  10/25/23 223 lb 12.8 oz (101.5 kg)  10/15/23 224 lb 13.9 oz (102 kg)  12/04/20 217 lb 13 oz (98.8 kg)    GEN: Well nourished, well developed in no acute distress NECK: No JVD; No carotid bruits CARDIAC: RRR, no murmurs, rubs, gallops RESPIRATORY:  Clear to auscultation without rales, wheezing or rhonchi  ABDOMEN: Soft, non-tender, non-distended EXTREMITIES:  No edema; No deformity   ASSESSMENT AND PLAN: .   Coronary artery disease of native artery with stable angina.  Recent hospitalization  10/15/2023 was diagnosed with NSTEMI.  Left heart catheterization completed on 10/17/2023 revealed proximal LAD lesion 80% stenosis with 80% stenosed sidebranch.  Successful PCI/DES completed.  He was recommended to continue DAPT with aspirin and Brilinta 90 mg twice daily for 12 months.  He is continued on high intensity statin of Lipitor 40 mg daily, beta-blocker therapy of carvedilol 12.5 mg twice daily.  EKG today sinus rhythm concerns for ischemic change.  He is also being scheduled for cardiac rehab.  Previous had an episode of vasovagal during hospitalization and had chest discomfort.  No chest discomfort noted today but we are can decrease his Imdur 30 mg daily back down to 15 mg daily to allow for normal blood pressure.  He has been sent for labs today for CBC BMP.  Vasovagal episode noted during hospitalization prior to discharge with no drop in his blood pressure and heart rate.  At the same issue today when he was in the office his blood pressure was noted to be 90/68.  States that he was clammy and lightheaded.  Denied any chest discomfort or shortness of breath.  He was given water and encouraged to increase his oral intake as he drinks minimal amount of fluid throughout the day with a glass of juice with breakfast, drink with lunch, drink with that order.  Mixed hyperlipidemia with an LDL of 97 and an HDL of 53 LP(a) was normal.  He is continued on atorvastatin 40 mg daily.  When he LFTs and fasting lipid panel in 8 to 10 weeks  Primary hypertension with some hypotension noted today.  With his vasovagal episode today in clinic atrial blood pressure in the 90s.  We are cutting back on Imdur to allow for increase in his pressure.  He has been encouraged to continue to monitor pressures 1 to 2 hours postmedication administration as well as decrease his Imdur.   Cardiac Rehabilitation Eligibility Assessment  The patient is ready to start cardiac rehabilitation from a cardiac standpoint.        Dispo: Patient to return to clinic in 6 weeks to see MD/APP or sooner if needed to reevaluate symptoms  Signed, Adelyn Roscher, NP

## 2023-10-25 NOTE — Patient Instructions (Signed)
Medication Instructions:  Your physician has recommended you make the following change in your medication:   DECREASE Isosorbide mononitrate to half a tablet (15 mg) once daily   *If you need a refill on your cardiac medications before your next appointment, please call your pharmacy*   Lab Work: CBC & BMP today  Lipid & Hepatic panel in 2 months. Nothing to eat or drink after midnight before except sip of water with your medications.   If you have labs (blood work) drawn today and your tests are completely normal, you will receive your results only by: MyChart Message (if you have MyChart) OR A paper copy in the mail If you have any lab test that is abnormal or we need to change your treatment, we will call you to review the results.   Testing/Procedures: None   Follow-Up: At [facility], you and your health needs are our priority.  As part of our continuing mission to provide you with exceptional heart care, we have created designated Provider Care Teams.  These Care Teams include your primary Cardiologist (physician) and Advanced Practice Providers (APPs -  Physician Assistants and Nurse Practitioners) who all work together to provide you with the care you need, when you need it.   Your next appointment:   6 week(s)  Provider:   Yvonne Kendall, MD or Charlsie Quest, NP    Other Instructions Referral placed for Cardiac rehab. Their number is [phone]

## 2023-10-26 LAB — CBC
Hematocrit: 44.5 % (ref 37.5–51.0)
Hemoglobin: 14.6 g/dL (ref 13.0–17.7)
MCH: 29.1 pg (ref 26.6–33.0)
MCHC: 32.8 g/dL (ref 31.5–35.7)
MCV: 89 fL (ref 79–97)
Platelets: 295 10*3/uL (ref 150–450)
RBC: 5.01 x10E6/uL (ref 4.14–5.80)
RDW: 12.5 % (ref 11.6–15.4)
WBC: 6.9 10*3/uL (ref 3.4–10.8)

## 2023-10-26 LAB — BASIC METABOLIC PANEL
BUN/Creatinine Ratio: 10 (ref 9–20)
BUN: 10 mg/dL (ref 6–24)
CO2: 24 mmol/L (ref 20–29)
Calcium: 9.4 mg/dL (ref 8.7–10.2)
Chloride: 104 mmol/L (ref 96–106)
Creatinine, Ser: 1.01 mg/dL (ref 0.76–1.27)
Glucose: 101 mg/dL — ABNORMAL HIGH (ref 70–99)
Potassium: 5.5 mmol/L — ABNORMAL HIGH (ref 3.5–5.2)
Sodium: 141 mmol/L (ref 134–144)
eGFR: 90 mL/min/{1.73_m2} (ref >59)

## 2023-10-27 ENCOUNTER — Other Ambulatory Visit: Payer: Self-pay

## 2023-10-27 DIAGNOSIS — E782 Mixed hyperlipidemia: Secondary | ICD-10-CM

## 2023-10-27 DIAGNOSIS — I10 Essential (primary) hypertension: Secondary | ICD-10-CM

## 2023-10-27 DIAGNOSIS — I25118 Atherosclerotic heart disease of native coronary artery with other forms of angina pectoris: Secondary | ICD-10-CM

## 2023-10-27 DIAGNOSIS — I214 Non-ST elevation (NSTEMI) myocardial infarction: Secondary | ICD-10-CM

## 2023-10-27 NOTE — Progress Notes (Signed)
Potassium elevated with not prior history of elevation. Make sure he is not taking any OTC supplements, drinking fluid replacements drinks with electrolytes, or using no-salt. Increase water intake. Repeat BMP this week.

## 2023-10-28 ENCOUNTER — Other Ambulatory Visit
Admission: RE | Admit: 2023-10-28 | Discharge: 2023-10-28 | Disposition: A | Discharge status: Home / Self Care | Payer: Managed Care, Other (non HMO) | Attending: Cardiology | Admitting: Cardiology

## 2023-10-28 DIAGNOSIS — I25118 Atherosclerotic heart disease of native coronary artery with other forms of angina pectoris: Secondary | ICD-10-CM | POA: Diagnosis present

## 2023-10-28 DIAGNOSIS — E782 Mixed hyperlipidemia: Secondary | ICD-10-CM | POA: Insufficient documentation

## 2023-10-28 DIAGNOSIS — I214 Non-ST elevation (NSTEMI) myocardial infarction: Secondary | ICD-10-CM | POA: Diagnosis present

## 2023-10-28 DIAGNOSIS — I10 Essential (primary) hypertension: Secondary | ICD-10-CM | POA: Diagnosis present

## 2023-10-28 LAB — BASIC METABOLIC PANEL
Anion gap: 8 (ref 5–15)
BUN: 14 mg/dL (ref 6–20)
CO2: 28 mmol/L (ref 22–32)
Calcium: 8.9 mg/dL (ref 8.9–10.3)
Chloride: 102 mmol/L (ref 98–111)
Creatinine, Ser: 0.94 mg/dL (ref 0.61–1.24)
GFR, Estimated: >60 mL/min (ref >60)
Glucose, Bld: 99 mg/dL (ref 70–99)
Potassium: 4.1 mmol/L (ref 3.5–5.1)
Sodium: 138 mmol/L (ref 135–145)

## 2023-10-29 NOTE — Progress Notes (Signed)
Potassium has improved. Kidney function remains stable. Continue current medication regimen without changes at this time.

## 2023-11-01 ENCOUNTER — Other Ambulatory Visit: Payer: Self-pay

## 2023-11-01 MED ORDER — CARVEDILOL 12.5 MG PO TABS: 12.5000 mg | ORAL_TABLET | Freq: Two times a day (BID) | ORAL | 3 refills | Status: DC

## 2023-11-01 MED ORDER — ASPIRIN 81 MG PO TBEC: 81.0000 mg | DELAYED_RELEASE_TABLET | Freq: Every day | ORAL | 3 refills | Status: DC

## 2023-11-01 MED ORDER — ATORVASTATIN CALCIUM 40 MG PO TABS: 40.0000 mg | ORAL_TABLET | Freq: Every day | ORAL | 3 refills | Status: DC

## 2023-11-01 MED ORDER — TICAGRELOR 90 MG PO TABS: 90.0000 mg | ORAL_TABLET | Freq: Two times a day (BID) | ORAL | 11 refills | Status: DC

## 2023-11-01 NOTE — Telephone Encounter (Signed)
Please make sure his cardiac rehab was ordered and that his refills were sent to [facility].

## 2023-12-06 ENCOUNTER — Ambulatory Visit: Payer: Managed Care, Other (non HMO) | Admitting: Cardiology

## 2023-12-28 NOTE — Progress Notes (Signed)
 Cardiology Office Note:  .   Date:  12/29/2023  ID:  Kent Floyd, DOB 1971-09-01, MRN [id number] PCP: Regino Slater, MD  [facility] Providers Cardiologist:  Darryle ONEIDA Decent, MD    History of Present Illness: .   Kent Floyd is a 53 y.o. male with a past medical history of coronary artery disease status post NSTEMI (10/17/2023), hypertension, hyperlipidemia, who is here today for follow-up.   He presented to [facility] emergency department on 10/15/2023 with additional high-sensitivity troponin of 294 that increased to 325.  CTA of the chest was performed to rule out pulmonary embolism.  [facility], ECG was without any signs of ischemia or infarct.  Patient was given high-dose aspirin  and initiated on heparin  infusion after bolus.  He was subsequently transferred to [facility].  He stated that his symptoms started around 10 AM [place] he suddenly noticed a burning sensation on the left side of his chest and went into his shoulder blades.  He said had associated symptoms of sweating and lightheadedness that lasted a few minutes then resolved.  He states he been evaluated [place] emergency medical services [place] the [place] for which she was given aspirin  and an ECG.  He did not bring that to the emergency department on his arrival.  Due to ongoing chest pain he send again his local emergency department for further evaluation.  He states a similar episode of chest pain occurred last week while at work that lasted a few minutes.  He said a total of 3 different episodes of chest discomfort in the last few months.  States that he has been seen by his primary care provider on a regular basis for borderline hypertension which she is being monitored for.  Initial blood pressure was 163/97, pulse 79, respirations 17, temperature 98.2.  CBC was unremarkable.  BMP showed elevated glucose.  Lipid panel revealed LDL of 97.  Electrolyte  resulted 17.6.  He was continued on heparin  infusion and was taken to the cardiac Cath Lab on 10/17/2023 for diagnostic heart catheterization which revealed the culprit lesion is a proximal LAD lesion that was 80% stenosis where he went successful placement of DES.  Mid LAD lesion was 40%, ramus lesion was 50% stenosed, proximal circumflex to mid circumflex lesion 30% stenosis of the proximal sidebranch of the first marginal, LV end-diastolic pressure was normal, there was no aortic valve stenosis.  Recommendations were to continue uninterrupted DAPT with aspirin  and Brilinta  for minimum of 12 months.  Echocardiogram revealed an LVEF of 60 to 65%, mild LVH, and no valvular abnormalities.  Hospitalization was complicated by vasovagal reaction post left heart catheterization which required an extended hospital stay.  He was continued on appropriate medication regimen and was subsequently considered stable for discharge 10/18/2023.   He was last seen in clinic 10/25/2023 overall from the cardiac perspective has been doing well.  He did have an episode of a vasovagal event.  Had been compliant with his medications.  Was waiting to hear from cardiac rehab.  Had several questions relating to regular medications and side effects.  His Imdur  dosing was cut back to assist with improving his blood pressure and he was sent for lab work and referred to cardiac rehab.  He returns to clinic today stating that he has been well from the cardiac perspective.  Since reducing his previous dose of isosorbide  he has no longer had any further dizziness/lightheadedness or vasovagal  episodes with lower blood pressures.  He denies any chest pain, shortness of breath, peripheral edema.  Has noted changes in bowel function and pattern but prior colonoscopy revealed 2 polyps and he has upcoming appointment with GI.  He denies any bleeding with any blood noted in his stool or urine.  Has been active without any reoccurrence of chest  discomfort or shortness of breath.  Has declined cardiac rehab.  He is more interested in dietary education and meal management.  Denies any recent hospitalizations or visits to the emergency department.  ROS: 10 point review of systems has been reviewed and considered negative was been listed in HPI  Studies Reviewed: .        Cath: 11-12-23    CULPRIT LESION: Prox LAD lesion is 80% stenosed with 80% stenosed side branch in 1st Sept.   A drug-eluting stent was successfully placed using a SYNERGY XD 3.50X16.  Postdilated to 4.1 mm; Post intervention, there is a 0% residual stenosis and the side branch was preserved at 80% residual stenosis., TIMI-3 flow maintained in both main branch and sidebranches.   Mid LAD lesion is 40% stenosed.   -----------------------------------------------------   Ramus lesion is 50% stenosed.   Prox Cx to Mid (dominant) Cx lesion is 30% stenosed proximal to side branch in 1st Mrg.   -----------------------------------------------------   LV end diastolic pressure is normal.   There is no aortic valve stenosis.   POST CATH DIAGNOSES Left dominant system with severe single-vessel disease-80% stenosis in the proximal LAD (at small 1st Diag/SP1). Successful DES PCI with Synergy DES 3.5 x 16 mm -> post-dilated to 4.1 mm.  (Lesion reduced to 0% with TIMI-3 flow maintained) Otherwise 50% proximal ramus intermedius and 30% proximal dominant LCx disease but no significant stenosis. Normal LVEDP with normal EF by Echo.  RECOMMENDATIONS   In the absence of any other complications or medical issues, we expect the patient to be ready for discharge from an interventional cardiology perspective on 11-12-2023.   Ensure the patient is on appropriate GDMT as tolerated including high-dose statin, beta-blocker, ARB. Assess for other CRF's including DM-2, and elevated LP(a)   Recommend uninterrupted dual antiplatelet therapy with Aspirin  81mg  daily and Ticagrelor  90mg  twice daily  for a minimum of 12 months (ACS-Class I recommendation).   For urgent procedures, would be potentially okay to hold DAPT (ASA 81 mg, Brilinta  90 mg twice daily) after 3 months, but but otherwise prefer to at least wait 6 months if possible.  (Patient has pending foot surgery scheduled for November 2024 which will need to be rescheduled.) For bruising would be okay to hold/stop aspirin  after 6 months. Given proximal LAD location, would consider continuing second year of SAPT -monotherapy with either Brilinta  60 mg twice daily or clopidogrel 75 mg daily  Echo: 10/16/2023  1. Left ventricular ejection fraction, by estimation, is 60 to 65%. The  left ventricle has normal function. The left ventricle has no regional  wall motion abnormalities. There is mild left ventricular hypertrophy.  Left ventricular diastolic parameters  were normal.   2. Right ventricular systolic function is normal. The right ventricular  size is normal. Tricuspid regurgitation signal is inadequate for assessing  PA pressure.   3. The mitral valve is normal in structure. No evidence of mitral valve  regurgitation.   4. The aortic valve is tricuspid. Aortic valve regurgitation is not  visualized. No aortic stenosis is present.   5. The inferior vena cava is normal in size with  greater than 50%  respiratory variability, suggesting right atrial pressure of 3 mmHg.     Risk Assessment/Calculations:             Physical Exam:   VS:  BP 119/84 (BP Location: Left Arm, Patient Position: Sitting, Cuff Size: Normal)   Pulse 70   Ht 6' 2 (1.88 m)   Wt 224 lb (101.6 kg)   SpO2 98%   BMI 28.76 kg/m    Wt Readings from Last 3 Encounters:  12/29/23 224 lb (101.6 kg)  10/25/23 223 lb 12.8 oz (101.5 kg)  10/15/23 224 lb 13.9 oz (102 kg)    GEN: Well nourished, well developed in no acute distress NECK: No JVD; No carotid bruits CARDIAC: RRR, no murmurs, rubs, gallops RESPIRATORY:  Clear to auscultation without rales, wheezing  or rhonchi  ABDOMEN: Soft, non-tender, non-distended EXTREMITIES:  No edema; No deformity   ASSESSMENT AND PLAN: .   Coronary artery disease of native coronary artery with stable angina.  Left heart catheterization completed on 10/09/2023 revealed approximately dilation 80% stenosis with 80% stenosed sidebranch.  Underwent successful PCI/DES.  Recommended with DAPT of aspirin  and Brilinta  90 mg twice daily for minimum of 12 months.  He is continued on high intensity statin of atorvastatin  40 mg daily.  He denies any symptoms of angina or anginal equivalents.  No further ischemic testing needed at this time.  Referral sent to healthy weight and wellness for further dietary education and recommendations.  Mixed hyperlipidemia with LDL 97.  Sent for repeat LFTs fasting lipid panel today.  Currently continued on atorvastatin  40 mg daily.  Primary hypertension with blood pressure today 119/84.  Blood pressures remain stable.  Since reducing the dose of his isosorbide  he said no further vasovagal episodes.  He has been encouraged to monitor his pressures as needed at home.       Dispo: Patient return to clinic to see MD/APP in 4 to 5 months or sooner if needed for further evaluation  Signed, Shaneen Reeser, NP

## 2023-12-29 ENCOUNTER — Encounter: Payer: Self-pay | Admitting: Cardiology

## 2023-12-29 ENCOUNTER — Ambulatory Visit: Payer: 59 | Attending: Cardiology | Admitting: Cardiology

## 2023-12-29 VITALS — BP 119/84 | HR 70 | Ht 74.0 in | Wt 224.0 lb

## 2023-12-29 DIAGNOSIS — I10 Essential (primary) hypertension: Secondary | ICD-10-CM

## 2023-12-29 DIAGNOSIS — I25118 Atherosclerotic heart disease of native coronary artery with other forms of angina pectoris: Secondary | ICD-10-CM | POA: Diagnosis not present

## 2023-12-29 DIAGNOSIS — E782 Mixed hyperlipidemia: Secondary | ICD-10-CM

## 2023-12-29 NOTE — Patient Instructions (Addendum)
 Medication Instructions:  No changes at this time.   *If you need a refill on your cardiac medications before your next appointment, please call your pharmacy*   Lab Work: None  If you have labs (blood work) drawn today and your tests are completely normal, you will receive your results only by: MyChart Message (if you have MyChart) OR A paper copy in the mail If you have any lab test that is abnormal or we need to change your treatment, we will call you to review the results.   Testing/Procedures: None   Follow-Up: At [facility], you and your health needs are our priority.  As part of our continuing mission to provide you with exceptional heart care, we have created designated Provider Care Teams.  These Care Teams include your primary Cardiologist (physician) and Advanced Practice Providers (APPs -  Physician Assistants and Nurse Practitioners) who all work together to provide you with the care you need, when you need it.    Your next appointment:   4- month(s)  Provider:   You may see Darryle ONEIDA Decent, MD or one of the following Advanced Practice Providers on your designated Care Team:   Lonni Meager, NP Bernardino Bring, PA-C Cadence Franchester, PA-C Tylene Lunch, NP Barnie Hila, NP    Other Instructions Referral to [facility] weight management.  [phone]

## 2023-12-30 LAB — LIPID PANEL
Chol/HDL Ratio: 2.4 {ratio} (ref 0.0–5.0)
Cholesterol, Total: 129 mg/dL (ref 100–199)
HDL: 53 mg/dL (ref >39)
LDL Chol Calc (NIH): 60 mg/dL (ref 0–99)
Triglycerides: 85 mg/dL (ref 0–149)
VLDL Cholesterol Cal: 16 mg/dL (ref 5–40)

## 2023-12-30 LAB — HEPATIC FUNCTION PANEL
ALT: 88 [IU]/L — ABNORMAL HIGH (ref 0–44)
AST: 46 [IU]/L — ABNORMAL HIGH (ref 0–40)
Albumin: 4.4 g/dL (ref 3.8–4.9)
Alkaline Phosphatase: 94 [IU]/L (ref 44–121)
Bilirubin Total: 0.5 mg/dL (ref 0.0–1.2)
Bilirubin, Direct: 0.18 mg/dL (ref 0.00–0.40)
Total Protein: 7.3 g/dL (ref 6.0–8.5)

## 2023-12-31 DIAGNOSIS — Z79899 Other long term (current) drug therapy: Secondary | ICD-10-CM

## 2024-01-02 NOTE — Telephone Encounter (Signed)
 Recommend rechecking in 4-6 weeks. Watch fast, fried, and fatty foods as well as alcohol intake as these will increase the liver functions.

## 2024-02-01 ENCOUNTER — Other Ambulatory Visit
Admission: RE | Admit: 2024-02-01 | Discharge: 2024-02-01 | Disposition: A | Discharge status: Home / Self Care | Payer: 59 | Attending: Cardiology | Admitting: Cardiology

## 2024-02-01 DIAGNOSIS — Z79899 Other long term (current) drug therapy: Secondary | ICD-10-CM | POA: Insufficient documentation

## 2024-02-01 LAB — HEPATIC FUNCTION PANEL
ALT: 62 U/L — ABNORMAL HIGH (ref 0–44)
AST: 35 U/L (ref 15–41)
Albumin: 4.1 g/dL (ref 3.5–5.0)
Alkaline Phosphatase: 69 U/L (ref 38–126)
Bilirubin, Direct: 0.2 mg/dL (ref 0.0–0.2)
Indirect Bilirubin: 0.7 mg/dL (ref 0.3–0.9)
Total Bilirubin: 0.9 mg/dL (ref 0.0–1.2)
Total Protein: 7.4 g/dL (ref 6.5–8.1)

## 2024-02-02 NOTE — Progress Notes (Signed)
Liver functions have improved. Continue current medication regimen without changes.

## 2024-02-02 NOTE — Telephone Encounter (Signed)
The results have been trending down. There are several causes for this to be elevated. These results can fluctuate related to viral infection, diet changes, and some medications to name a few. With the number going down from prior results would just continue to monitor periodically. The rest of the panel was normal so the liver is functioning appropriately.

## 2024-02-13 MED ORDER — ATORVASTATIN CALCIUM 40 MG PO TABS: 40.0000 mg | ORAL_TABLET | Freq: Every day | ORAL | 3 refills | Status: DC

## 2024-02-13 MED ORDER — CARVEDILOL 12.5 MG PO TABS: 12.5000 mg | ORAL_TABLET | Freq: Two times a day (BID) | ORAL | 3 refills | Status: DC

## 2024-02-13 MED ORDER — ISOSORBIDE MONONITRATE ER 30 MG PO TB24: 15.0000 mg | ORAL_TABLET | Freq: Every day | ORAL | 3 refills | Status: DC

## 2024-02-22 ENCOUNTER — Encounter (INDEPENDENT_AMBULATORY_CARE_PROVIDER_SITE_OTHER): Payer: Self-pay

## 2024-04-27 ENCOUNTER — Ambulatory Visit: Payer: 59 | Admitting: Cardiology

## 2024-05-16 ENCOUNTER — Ambulatory Visit: Payer: Self-pay | Attending: Cardiology | Admitting: Cardiology

## 2024-05-16 ENCOUNTER — Encounter: Payer: Self-pay | Admitting: Cardiology

## 2024-05-16 VITALS — BP 120/72 | HR 61 | Ht 74.0 in | Wt 223.6 lb

## 2024-05-16 DIAGNOSIS — E782 Mixed hyperlipidemia: Secondary | ICD-10-CM | POA: Diagnosis not present

## 2024-05-16 DIAGNOSIS — I10 Essential (primary) hypertension: Secondary | ICD-10-CM

## 2024-05-16 DIAGNOSIS — Z79899 Other long term (current) drug therapy: Secondary | ICD-10-CM | POA: Diagnosis not present

## 2024-05-16 DIAGNOSIS — I25118 Atherosclerotic heart disease of native coronary artery with other forms of angina pectoris: Secondary | ICD-10-CM | POA: Diagnosis not present

## 2024-05-16 DIAGNOSIS — N529 Male erectile dysfunction, unspecified: Secondary | ICD-10-CM

## 2024-05-16 DIAGNOSIS — R61 Generalized hyperhidrosis: Secondary | ICD-10-CM

## 2024-05-16 MED ORDER — ATORVASTATIN CALCIUM 40 MG PO TABS: 40.0000 mg | ORAL_TABLET | Freq: Every day | ORAL | 3 refills | Status: AC

## 2024-05-16 MED ORDER — ISOSORBIDE MONONITRATE ER 30 MG PO TB24: 15.0000 mg | ORAL_TABLET | Freq: Every day | ORAL | 3 refills | Status: DC

## 2024-05-16 MED ORDER — CARVEDILOL 12.5 MG PO TABS: 12.5000 mg | ORAL_TABLET | Freq: Two times a day (BID) | ORAL | 3 refills | Status: DC

## 2024-05-16 NOTE — Patient Instructions (Signed)
 Medication Instructions:  Your Physician recommend you continue on your current medication as directed.    *If you need a refill on your cardiac medications before your next appointment, please call your pharmacy*  Lab Work: Your provider would like for you to have following labs drawn today TSH Free T4.   If you have labs (blood work) drawn today and your tests are completely normal, you will receive your results only by: MyChart Message (if you have MyChart) OR A paper copy in the mail If you have any lab test that is abnormal or we need to change your treatment, we will call you to review the results.  Testing/Procedures: No test ordered today   Follow-Up: At [facility], you and your health needs are our priority.  As part of our continuing mission to provide you with exceptional heart care, our providers are all part of one team.  This team includes your primary Cardiologist (physician) and Advanced Practice Providers or APPs (Physician Assistants and Nurse Practitioners) who all work together to provide you with the care you need, when you need it.  Your next appointment:   6 month(s)  Provider:   Randene Bustard, MD

## 2024-05-16 NOTE — Progress Notes (Signed)
 Cardiology Office Note:  .   Date:  05/16/2024  ID:  Kent Floyd, DOB [birth year], MRN [id number] PCP: Kent Pinal, MD  [facility] Providers Cardiologist:  Kent Bigness, MD    History of Present Illness: .   Kent Floyd is a 53 y.o. male with a past medical history of coronary artery disease status post NSTEMI (10/17/2023), hypertension, hyperlipidemia, who presented for follow-up of his coronary artery disease.   He presented to [facility] emergency department on 10/15/2023 with additional high-sensitivity troponin of 294 that increased to 325.  CTA of the chest was performed to rule out pulmonary embolism.  [facility], ECG was without any signs of ischemia or infarct.  Patient was given high-dose aspirin  and initiated on heparin  infusion after bolus.  He was subsequently transferred to [facility].  He stated that his symptoms started around 10 AM while [place] he suddenly noticed a burning sensation on the left side of his chest and went into his shoulder blades.  He said had associated symptoms of sweating and lightheadedness that lasted a few minutes then resolved.  He states he been evaluated [place] emergency medical services [place] the [place] for which she was given aspirin  and an ECG.  He did not bring that to the emergency department on his arrival.  Due to ongoing chest pain he send again his local emergency department for further evaluation.  He states a similar episode of chest pain occurred last week while at work that lasted a few minutes.  He said a total of 3 different episodes of chest discomfort in the last few months.  States that he has been seen by his primary care provider on a regular basis for borderline hypertension which she is being monitored for.  Initial blood pressure was 163/97, pulse 79, respirations 17, temperature 98.2.  CBC was unremarkable.  BMP showed elevated glucose.  Lipid panel  revealed LDL of 97.  Electrolyte resulted 17.6.  He was continued on heparin  infusion and was taken to the cardiac Cath Lab on 10/17/2023 for diagnostic heart catheterization which revealed the culprit lesion is a proximal LAD lesion that was 80% stenosis where he went successful placement of DES.  Mid LAD lesion was 40%, ramus lesion was 50% stenosed, proximal circumflex to mid circumflex lesion 30% stenosis of the proximal sidebranch of the first marginal, LV end-diastolic pressure was normal, there was no aortic valve stenosis.  Recommendations were to continue uninterrupted DAPT with aspirin  and Brilinta  for minimum of 12 months.  Echocardiogram revealed an LVEF of 60 to 65%, mild LVH, and no valvular abnormalities.  Hospitalization was complicated by vasovagal reaction post left heart catheterization which required an extended hospital stay.  He was continued on appropriate medication regimen and was subsequently considered stable for discharge 10/18/2023.   He was last seen in clinic 12/29/2023 where he been doing well with cardiac perspective.  Since reducing his previous doses isosorbide  he no longer had any dizziness or lightheadedness or vasovagal episodes of lower blood pressures.  He has been compliant with his current medication regimen without any undue side effects.  He was referred to healthy weight and wellness for further dietary education and recommendations.  No medication changes were made at the time of further testing was scheduled.  He returns to clinic today that he has been doing well from a cardiac perspective.  Continues to deny any dizziness lightheadedness or vasovagal episodes with reduction  in Imdur .  He continues to remain active with a large quantity of walking at work without any reoccurrence of chest discomfort or shortness of breath.  He states that he has not been suffering with increased amount of sweating, flushing, and warmth.  During his catheterization his symptoms would  have resolved but they have not.  He states that he has been compliant with his current medication regimen.  States he has not had any undue side effects from the medication with the exception of he has noticed some changes in his sex life with beginning stages of erectile dysfunction that he is concerned may be related directly to his medications.  He denies any hospitalization or visits to the emergency department.  ROS: 10 point review of systems were reviewed and considered negative except ones were listed in the HPI  Studies Reviewed: .       Cath: 10/17/2023    CULPRIT LESION: Prox LAD lesion is 80% stenosed with 80% stenosed side branch in 1st Sept.   A drug-eluting stent was successfully placed using a SYNERGY XD [id number].  Postdilated to 4.1 mm; Post intervention, there is a 0% residual stenosis and the side branch was preserved at 80% residual stenosis., TIMI-3 flow maintained in both main branch and sidebranches.   Mid LAD lesion is 40% stenosed.   -----------------------------------------------------   Ramus lesion is 50% stenosed.   Prox Cx to Mid (dominant) Cx lesion is 30% stenosed proximal to side branch in 1st Mrg.   -----------------------------------------------------   LV end diastolic pressure is normal.   There is no aortic valve stenosis.   POST CATH DIAGNOSES Left dominant system with severe single-vessel disease-80% stenosis in the proximal LAD (at small 1st Diag/SP1). Successful DES PCI with Synergy DES 3.5 x 16 mm -> post-dilated to 4.1 mm.  (Lesion reduced to 0% with TIMI-3 flow maintained) Otherwise 50% proximal ramus intermedius and 30% proximal dominant LCx disease but no significant stenosis. Normal LVEDP with normal EF by Echo.  RECOMMENDATIONS   In the absence of any other complications or medical issues, we expect the patient to be ready for discharge from an interventional cardiology perspective on 10/17/2023.   Ensure the patient is on appropriate GDMT as  tolerated including high-dose statin, beta-blocker, ARB. Assess for other CRF's including DM-2, and elevated LP(a)   Recommend uninterrupted dual antiplatelet therapy with Aspirin  81mg  daily and Ticagrelor  90mg  twice daily for a minimum of 12 months (ACS-Class I recommendation).   For urgent procedures, would be potentially okay to hold DAPT (ASA 81 mg, Brilinta  90 mg twice daily) after 3 months, but but otherwise prefer to at least wait 6 months if possible.  (Patient has pending foot surgery scheduled for November 2024 which will need to be rescheduled.) For bruising would be okay to hold/stop aspirin  after 6 months. Given proximal LAD location, would consider continuing second year of SAPT -monotherapy with either Brilinta  60 mg twice daily or clopidogrel 75 mg daily   Echo: 10/16/2023  1. Left ventricular ejection fraction, by estimation, is 60 to 65%. The  left ventricle has normal function. The left ventricle has no regional  wall motion abnormalities. There is mild left ventricular hypertrophy.  Left ventricular diastolic parameters  were normal.   2. Right ventricular systolic function is normal. The right ventricular  size is normal. Tricuspid regurgitation signal is inadequate for assessing  PA pressure.   3. The mitral valve is normal in structure. No evidence of mitral valve  regurgitation.  4. The aortic valve is tricuspid. Aortic valve regurgitation is not  visualized. No aortic stenosis is present.   5. The inferior vena cava is normal in size with greater than 50%  respiratory variability, suggesting right atrial pressure of 3 mmHg.   Risk Assessment/Calculations:             Physical Exam:   VS:  BP 120/72   Pulse 61   Ht 6\' 2"  (1.88 m)   Wt 223 lb 9.6 oz (101.4 kg)   SpO2 97%   BMI 28.71 kg/m    Wt Readings from Last 3 Encounters:  05/16/24 223 lb 9.6 oz (101.4 kg)  12/29/23 224 lb (101.6 kg)  10/25/23 223 lb 12.8 oz (101.5 kg)    GEN: Well nourished, well  developed in no acute distress NECK: No JVD; No carotid bruits CARDIAC: RRR, no murmurs, rubs, gallops RESPIRATORY:  Clear to auscultation without rales, wheezing or rhonchi  ABDOMEN: Soft, non-tender, non-distended EXTREMITIES:  No edema; No deformity   ASSESSMENT AND PLAN: .   Coronary artery disease of native coronary artery with unstable angina.  Left heart catheterization completed 10/09/23 revealed approximately 80% stenosis in the proximal LAD with 80% stenosis sidebranch.  He was treated with PCI/DES.  Recommendation was to continue with DAPT with aspirin  81 mg daily and Brilinta  90 mg twice daily for minimum of 12 months, given the proximal LAD location would consider continuing second year DAPT-monotherapy with either Brilinta  60 mg twice daily clopidogrel 75 mg daily.  His 12 months will be October 20.  He is continued on intensity statin of atorvastatin  40 mg daily.  He has not required the use of his as needed Nitrostat .  No further ischemic evaluation needed at this time.  Mixed hyperlipidemia with last LDL 60.  LDL continues to improve.  He is continued on atorvastatin  40 mg daily and encouraged to continue with his dietary modification and increased exercise.  Primary hypertension with a blood pressure today of 120/72.  Blood pressures remain stable on carvedilol  12.5 mg twice daily and Imdur  30 mg daily.  He has been encouraged to continue to monitor his pressures at home.  Symptoms of erectile dysfunction where he is able to gain an erection but is unable to maintain an erection.  Discusses several contributing factors primarily cholesterol, medication side effects, and age.  Unfortunately with him on Imdur  he has been unable to take PDE 5 inhibitors.  If he continues to suffer from recurrent symptoms or any worsening of symptoms can consider referring to urology.  Profuse diaphoresis that has been ongoing since prior to initial coronary event.  Upon chart review it is noted that he  has had several elevated thyroid levels.  He has been sent for an updated TSH and free T4 today.  He has also been advised to follow-up with PCP and if his results come back extremely abnormal he may require a referral to endocrinology.       Dispo: Patient return to clinic to see MD/APP in 6 months or sooner if needed for further evaluation.  Signed, Azia Toutant, NP

## 2024-05-17 ENCOUNTER — Ambulatory Visit: Payer: Self-pay | Admitting: Cardiology

## 2024-05-17 LAB — TSH: TSH: 2.98 u[IU]/mL (ref 0.450–4.500)

## 2024-05-17 LAB — T4, FREE: Free T4: 0.99 ng/dL (ref 0.82–1.77)

## 2024-05-17 NOTE — Progress Notes (Signed)
 Thyroid functioning as far as TSH and free T4 are within normal limits.  No further testing or medication changes needed at this time

## 2024-07-05 ENCOUNTER — Encounter: Payer: Self-pay | Admitting: Cardiology

## 2024-07-05 ENCOUNTER — Ambulatory Visit: Payer: Self-pay | Admitting: Cardiology

## 2024-07-05 ENCOUNTER — Ambulatory Visit: Attending: Cardiology | Admitting: Cardiology

## 2024-07-05 ENCOUNTER — Other Ambulatory Visit
Admission: RE | Admit: 2024-07-05 | Discharge: 2024-07-05 | Disposition: A | Discharge status: Home / Self Care | Source: Ambulatory Visit | Attending: Cardiology | Admitting: Cardiology

## 2024-07-05 VITALS — BP 108/80 | HR 66 | Ht 74.0 in | Wt 214.5 lb

## 2024-07-05 DIAGNOSIS — I10 Essential (primary) hypertension: Secondary | ICD-10-CM

## 2024-07-05 DIAGNOSIS — R079 Chest pain, unspecified: Secondary | ICD-10-CM

## 2024-07-05 DIAGNOSIS — I25118 Atherosclerotic heart disease of native coronary artery with other forms of angina pectoris: Secondary | ICD-10-CM

## 2024-07-05 DIAGNOSIS — E782 Mixed hyperlipidemia: Secondary | ICD-10-CM | POA: Diagnosis not present

## 2024-07-05 DIAGNOSIS — Z79899 Other long term (current) drug therapy: Secondary | ICD-10-CM

## 2024-07-05 DIAGNOSIS — R61 Generalized hyperhidrosis: Secondary | ICD-10-CM

## 2024-07-05 LAB — CBC
HCT: 44.3 % (ref 39.0–52.0)
Hemoglobin: 14.7 g/dL (ref 13.0–17.0)
MCH: 28.8 pg (ref 26.0–34.0)
MCHC: 33.2 g/dL (ref 30.0–36.0)
MCV: 86.7 fL (ref 80.0–100.0)
Platelets: 249 K/uL (ref 150–400)
RBC: 5.11 MIL/uL (ref 4.22–5.81)
RDW: 12.7 % (ref 11.5–15.5)
WBC: 8.1 K/uL (ref 4.0–10.5)
nRBC: 0 % (ref 0.0–0.2)

## 2024-07-05 LAB — BASIC METABOLIC PANEL WITH GFR
Anion gap: 8 (ref 5–15)
BUN: 13 mg/dL (ref 6–20)
CO2: 26 mmol/L (ref 22–32)
Calcium: 9.2 mg/dL (ref 8.9–10.3)
Chloride: 105 mmol/L (ref 98–111)
Creatinine, Ser: 1.01 mg/dL (ref 0.61–1.24)
GFR, Estimated: >60 mL/min (ref >60)
Glucose, Bld: 110 mg/dL — ABNORMAL HIGH (ref 70–99)
Potassium: 4 mmol/L (ref 3.5–5.1)
Sodium: 139 mmol/L (ref 135–145)

## 2024-07-05 LAB — TROPONIN I (HIGH SENSITIVITY): Troponin I (High Sensitivity): 3 ng/L (ref <18)

## 2024-07-05 NOTE — Patient Instructions (Signed)
 Medication Instructions:  Your physician recommends that you continue on your current medications as directed. Please refer to the Current Medication list given to you today.   *If you need a refill on your cardiac medications before your next appointment, please call your pharmacy*  Lab Work: Your provider would like for you to have following labs drawn today CBC, BMP, Triponin.   If you have labs (blood work) drawn today and your tests are completely normal, you will receive your results only by: MyChart Message (if you have MyChart) OR A paper copy in the mail If you have any lab test that is abnormal or we need to change your treatment, we will call you to review the results.  Testing/Procedures:    Please report to Radiology at the [facility] Main Entrance 30 minutes early for your test.  [address] [place], [place] [address]                         OR   Please report to Radiology at [facility] Main Entrance, medical mall, 30 mins prior to your test.  [address]  [place], [place]  How to Prepare for Your Cardiac PET/CT Stress Test:  Nothing to eat or drink, except water, 3 hours prior to arrival time.  NO caffeine/decaffeinated products, or chocolate 12 hours prior to arrival. (Please note decaffeinated beverages (teas/coffees) still contain caffeine).  If you have caffeine within 12 hours prior, the test will need to be rescheduled.  Medication instructions: Do not take erectile dysfunction medications for 72 hours prior to test (sildenafil, tadalafil) Do not take nitrates (isosorbide  mononitrate, Ranexa) the day before or day of test  You may take your remaining medications with water.  NO perfume, cologne or lotion on chest or abdomen area.  Total time is 1 to 2 hours; you may want to bring reading material for the waiting time.   In preparation for your appointment, medication and supplies will be purchased.  Appointment  availability is limited, so if you need to cancel or reschedule, please call the Radiology Department Scheduler at [phone] 24 hours in advance to avoid a cancellation fee of $100.00  What to Expect When you Arrive:  Once you arrive and check in for your appointment, you will be taken to a preparation room within the Radiology Department.  A technologist or Nurse will obtain your medical history, verify that you are correctly prepped for the exam, and explain the procedure.  Afterwards, an IV will be started in your arm and electrodes will be placed on your skin for EKG monitoring during the stress portion of the exam. Then you will be escorted to the PET/CT scanner.  There, staff will get you positioned on the scanner and obtain a blood pressure and EKG.  During the exam, you will continue to be connected to the EKG and blood pressure machines.  A small, safe amount of a radioactive tracer will be injected in your IV to obtain a series of pictures of your heart along with an injection of a stress agent.    After your Exam:  It is recommended that you eat a meal and drink a caffeinated beverage to counter act any effects of the stress agent.  Drink plenty of fluids for the remainder of the day and urinate frequently for the first couple of hours after the exam.  Your doctor will inform you of your test results  within 7-10 business days.  For more information and frequently asked questions, please visit our website: [email or web address]  For questions about your test or how to prepare for your test, please call: Cardiac Imaging Nurse Navigators Office: [phone]   Follow-Up: At [facility], you and your health needs are our priority.  As part of our continuing mission to provide you with exceptional heart care, our providers are all part of one team.  This team includes your primary Cardiologist (physician) and Advanced Practice Providers or APPs (Physician Assistants  and Nurse Practitioners) who all work together to provide you with the care you need, when you need it.  Your next appointment:   3 - 4 week(s) post procedure  Provider:   Tylene Lunch, NP

## 2024-07-05 NOTE — Progress Notes (Signed)
 Kidney function remains stable without concerns for dehydration.  Troponin is negative so no myocardial infarction noted.  CBC without elevated white count so no concern for infection and stable hemoglobin.  Overall reassuring labs.  Continue with previously ordered stress testing.

## 2024-07-05 NOTE — Progress Notes (Signed)
 Cardiology Office Note   Date:  07/05/2024  ID:  Kent Floyd, DOB December 29, 1970, MRN [id number] PCP: Regino Slater, MD  [facility] Providers Cardiologist:  Darryle ONEIDA Decent, MD Cardiology APP:  Gerard Frederick, NP     History of Present Illness Kent Floyd is a 53 y.o. male with past medical history of coronary artery disease status post NSTEMI (09/2023), hypertension, hyperlipidemia, who presents today for follow-up of his coronary artery disease.   He presented to [facility] emergency department on 10/15/2023 with additional high-sensitivity troponin of 294 that increased to 325.  CTA of the chest was performed to rule out pulmonary embolism.  [facility] on [facility], ECG was without any signs of ischemia or infarct.  Patient was given high-dose aspirin  and initiated on heparin  infusion after bolus.  He was subsequently transferred to [facility].  He stated that his symptoms started around 10 AM while [place] he suddenly noticed a burning sensation on the left side of his chest and went into his shoulder blades.  He said had associated symptoms of sweating and lightheadedness that lasted a few minutes then resolved.  He states he been evaluated by emergency medical services at the festival for which she was given aspirin  and an ECG.  He did not bring that to the emergency department on his arrival.  Due to ongoing chest pain he send again his local emergency department for further evaluation.  He states a similar episode of chest pain occurred last week while at work that lasted a few minutes.  He said a total of 3 different episodes of chest discomfort in the last few months.  States that he has been seen by his primary care provider on a regular basis for borderline hypertension which she is being monitored for.  Initial blood pressure was 163/97, pulse 79, respirations 17, temperature 98.2.  CBC was unremarkable.  BMP showed  elevated glucose.  Lipid panel revealed LDL of 97. He was continued on heparin  infusion and was taken to the cardiac Cath Lab on 10/17/2023 for diagnostic heart catheterization which revealed the culprit lesion is a proximal LAD lesion that was 80% stenosis where he went successful placement of DES.  Mid LAD lesion was 40%, ramus lesion was 50% stenosed, proximal circumflex to mid circumflex lesion 30% stenosis of the proximal sidebranch of the first marginal, LV end-diastolic pressure was normal, there was no aortic valve stenosis.  Recommendations were to continue uninterrupted DAPT with aspirin  and Brilinta  for minimum of 12 months.  Echocardiogram revealed an LVEF of 60 to 65%, mild LVH, and no valvular abnormalities.  Hospitalization was complicated by vasovagal reaction post left heart catheterization which required an extended hospital stay.  He was continued on appropriate medication regimen and was subsequently considered stable for discharge 10/18/2023.  He was seen in clinic 12/21/2023 where he was doing well from a cardiac perspective.  Since reducing his previous dose of isosorbide  alone any dizziness or lightheadedness.  No further medication changes were made and no further testing was needed at that time.   He was last seen in clinic 05/16/2024 and was doing well from a cardiac perspective.  No further episodes with reduction of Imdur .  No recurrence of chest discomfort or shortness of breath.  There were no medication changes that were made and no testing that was ordered.  He returns to clinic today after walking and requesting to be seen due to complaints of chest discomfort  and labile blood pressures.  He is accompanied by his wife today.  And starting last Tuesday he had noted an elevated blood pressure which led to having pain between his shoulder blades in his back leading up his neck to the occipital lobe in his head.  He has also been suffering from some pinching chest pain since his  elevated blood pressure.  Pressure would be extremely elevated and then drop extremely low.  He has been monitoring his pressure at home and at work.  He has noted he has been doing work outside in the heat and humidity and questions possibility of dehydration.  He continues to have some lightheadedness but it is improved.  He has noted increased mental weakness and fatigue since the event had happened.  ROS: 10 point review of systems has been reviewed and considered negative the exception was been listed in the HPI  Studies Reviewed EKG Interpretation Date/Time:  Thursday July 05 2024 14:42:56 EDT Ventricular Rate:  66 PR Interval:  156 QRS Duration:  102 QT Interval:  384 QTC Calculation: 402 R Axis:   75  Text Interpretation: Normal sinus rhythm Normal ECG When compared with ECG of 25-Oct-2023 11:00, No significant change was found Confirmed by Gerard Frederick ([id number]) on 07/05/2024 2:44:54 PM    Cath: 10/17/2023    CULPRIT LESION: Prox LAD lesion is 80% stenosed with 80% stenosed side branch in 1st Sept.   A drug-eluting stent was successfully placed using a SYNERGY XD [id number].  Postdilated to 4.1 mm; Post intervention, there is a 0% residual stenosis and the side branch was preserved at 80% residual stenosis., TIMI-3 flow maintained in both main branch and sidebranches.   Mid LAD lesion is 40% stenosed.   -----------------------------------------------------   Ramus lesion is 50% stenosed.   Prox Cx to Mid (dominant) Cx lesion is 30% stenosed proximal to side branch in 1st Mrg.   -----------------------------------------------------   LV end diastolic pressure is normal.   There is no aortic valve stenosis.   POST CATH DIAGNOSES Left dominant system with severe single-vessel disease-80% stenosis in the proximal LAD (at small 1st Diag/SP1). Successful DES PCI with Synergy DES 3.5 x 16 mm -> post-dilated to 4.1 mm.  (Lesion reduced to 0% with TIMI-3 flow maintained) Otherwise 50%  proximal ramus intermedius and 30% proximal dominant LCx disease but no significant stenosis. Normal LVEDP with normal EF by Echo.  RECOMMENDATIONS   In the absence of any other complications or medical issues, we expect the patient to be ready for discharge from an interventional cardiology perspective on 10/17/2023.   Ensure the patient is on appropriate GDMT as tolerated including high-dose statin, beta-blocker, ARB. Assess for other CRF's including DM-2, and elevated LP(a)   Recommend uninterrupted dual antiplatelet therapy with Aspirin  81mg  daily and Ticagrelor  90mg  twice daily for a minimum of 12 months (ACS-Class I recommendation).   For urgent procedures, would be potentially okay to hold DAPT (ASA 81 mg, Brilinta  90 mg twice daily) after 3 months, but but otherwise prefer to at least wait 6 months if possible.  (Patient has pending foot surgery scheduled for November 2024 which will need to be rescheduled.) For bruising would be okay to hold/stop aspirin  after 6 months. Given proximal LAD location, would consider continuing second year of SAPT -monotherapy with either Brilinta  60 mg twice daily or clopidogrel 75 mg daily   Echo: 10/16/2023  1. Left ventricular ejection fraction, by estimation, is 60 to 65%. The  left ventricle has normal  function. The left ventricle has no regional  wall motion abnormalities. There is mild left ventricular hypertrophy.  Left ventricular diastolic parameters  were normal.   2. Right ventricular systolic function is normal. The right ventricular  size is normal. Tricuspid regurgitation signal is inadequate for assessing  PA pressure.   3. The mitral valve is normal in structure. No evidence of mitral valve  regurgitation.   4. The aortic valve is tricuspid. Aortic valve regurgitation is not  visualized. No aortic stenosis is present.   5. The inferior vena cava is normal in size with greater than 50%  respiratory variability, suggesting right atrial  pressure of 3 mmHg.   Risk Assessment/Calculations         Physical Exam VS:  BP 108/80 (BP Location: Left Arm, Patient Position: Sitting, Cuff Size: Normal)   Pulse 66   Ht 6' 2 (1.88 m)   Wt 214 lb 8 oz (97.3 kg)   BMI 27.54 kg/m   Orthostatic VS for the past 24 hrs (Last 3 readings):  BP- Lying Pulse- Lying BP- Sitting Pulse- Sitting BP- Standing at 0 minutes Pulse- Standing at 0 minutes BP- Standing at 3 minutes Pulse- Standing at 3 minutes  07/05/24 1512 125/78 65 124/81 71 113/74 75 116/79 82      Wt Readings from Last 3 Encounters:  07/05/24 214 lb 8 oz (97.3 kg)  05/16/24 223 lb 9.6 oz (101.4 kg)  12/29/23 224 lb (101.6 kg)    GEN: Well nourished, well developed in no acute distress NECK: No JVD; No carotid bruits CARDIAC: RRR, no murmurs, rubs, gallops RESPIRATORY:  Clear to auscultation without rales, wheezing or rhonchi  ABDOMEN: Soft, non-tender, non-distended EXTREMITIES:  No edema; No deformity   ASSESSMENT AND PLAN Coronary artery disease of native coronary artery with typical and atypical chest discomfort.  Last left heart catheterization was completed 09/2023 which revealed proximal 80% stenosis proximal LAD and 80% stenosis sidebranch.  He was treated with PCI/DES.  Recommendation was to continue DAPT with aspirin  and Brilinta  for minimum of 12 months given the proximal LAD location will consider continuing the second year of DAPT either monotherapy with either Brilinta  60 mg twice daily or clopidogrel 75 mg daily.  30-month.  Will be up in October.  With his twinging of chest discomfort with rest and exertion he has been scheduled for cardiac PET stress to evaluate any ischemia as a cardiac PET stress Offers higher resolution, allowing for the detection of even small blockages. It also allows for quantitative assessment of coronary flow reserve.  Which is unable to be obtained on a Lexiscan or treadmill stress testing.  He is also being sent today for blood work  elevated troponin, CBC, and BMP.  If his troponin comes back positive stress testing will be canceled and he will be sent to the emergency department scheduled for a left heart catheterization.  Mixed hyperlipidemia with last LDL of 60.  LDL continues to work.  He is continued on atorvastatin  40 mg daily and encouraged to continue with dietary modification and increased exercise.  Primary hypertension with episodes of labile blood pressure.  Blood pressure today 108/88.  Review of both of his blood pressure comes from home and from work he has been fluctuating and labile blood pressures since Tuesday.  He has been as high as 150/100 and is low as 90/50.  Today his blood pressure is stable.  He is continued on carvedilol  12.5 mg twice daily, Imdur  15 mg daily.  He has been encouraged to continue to monitor his pressure 1 to 2 hours postmedication administration as well.  Profuse diaphoresis with activity.  He is being sent for updated chemistry panel today to rule out dehydration.  He also had orthostatic vitals that were negative.       Dispo: Patient to return to clinic to see MD/APP once testing is completed or sooner if needed for further evaluation  Signed, Nazire Fruth, NP

## 2024-07-08 ENCOUNTER — Inpatient Hospital Stay
Admission: EM | Admit: 2024-07-08 | Discharge: 2024-07-09 | DRG: 916 | Disposition: A | Discharge status: Home / Self Care | Attending: Internal Medicine | Admitting: Internal Medicine

## 2024-07-08 ENCOUNTER — Encounter: Payer: Self-pay | Admitting: Emergency Medicine

## 2024-07-08 ENCOUNTER — Other Ambulatory Visit: Payer: Self-pay

## 2024-07-08 DIAGNOSIS — T782XXA Anaphylactic shock, unspecified, initial encounter: Secondary | ICD-10-CM | POA: Diagnosis not present

## 2024-07-08 DIAGNOSIS — E785 Hyperlipidemia, unspecified: Secondary | ICD-10-CM | POA: Diagnosis present

## 2024-07-08 DIAGNOSIS — I2510 Atherosclerotic heart disease of native coronary artery without angina pectoris: Secondary | ICD-10-CM | POA: Diagnosis present

## 2024-07-08 DIAGNOSIS — Z955 Presence of coronary angioplasty implant and graft: Secondary | ICD-10-CM

## 2024-07-08 DIAGNOSIS — Z7902 Long term (current) use of antithrombotics/antiplatelets: Secondary | ICD-10-CM

## 2024-07-08 DIAGNOSIS — T63461A Toxic effect of venom of wasps, accidental (unintentional), initial encounter: Secondary | ICD-10-CM | POA: Diagnosis present

## 2024-07-08 DIAGNOSIS — I10 Essential (primary) hypertension: Secondary | ICD-10-CM | POA: Diagnosis present

## 2024-07-08 DIAGNOSIS — Z7982 Long term (current) use of aspirin: Secondary | ICD-10-CM

## 2024-07-08 DIAGNOSIS — Z79899 Other long term (current) drug therapy: Secondary | ICD-10-CM

## 2024-07-08 MED ORDER — DIPHENHYDRAMINE HCL 50 MG/ML IJ SOLN
50.0000 mg | Freq: Once | INTRAMUSCULAR | Status: AC
  Administered 2024-07-08: 50 mg via INTRAVENOUS
  Filled 2024-07-08: qty 1

## 2024-07-08 MED ORDER — EPINEPHRINE 0.3 MG/0.3ML IJ SOAJ
INTRAMUSCULAR | Status: AC
  Filled 2024-07-08: qty 0.3

## 2024-07-08 MED ORDER — LACTATED RINGERS IV BOLUS
1000.0000 mL | Freq: Once | INTRAVENOUS | Status: AC
  Administered 2024-07-08: 1000 mL via INTRAVENOUS

## 2024-07-08 MED ORDER — EPINEPHRINE 0.3 MG/0.3ML IJ SOAJ
0.3000 mg | Freq: Once | INTRAMUSCULAR | Status: AC
  Administered 2024-07-08: 0.3 mg via INTRAMUSCULAR

## 2024-07-08 MED ORDER — EPINEPHRINE HCL 5 MG/250ML IV SOLN IN NS
5.0000 ug/min | INTRAVENOUS | Status: DC
  Administered 2024-07-09: 5 ug/min via INTRAVENOUS
  Filled 2024-07-08: qty 250

## 2024-07-08 MED ORDER — DEXAMETHASONE SODIUM PHOSPHATE 10 MG/ML IJ SOLN
10.0000 mg | Freq: Once | INTRAMUSCULAR | Status: AC
Start: 2024-07-08 — End: 2024-07-08
  Administered 2024-07-08: 10 mg via INTRAVENOUS
  Filled 2024-07-08: qty 1

## 2024-07-08 MED ORDER — FAMOTIDINE IN NACL 20-0.9 MG/50ML-% IV SOLN
20.0000 mg | Freq: Once | INTRAVENOUS | Status: AC
  Administered 2024-07-08: 20 mg via INTRAVENOUS
  Filled 2024-07-08: qty 50

## 2024-07-08 NOTE — ED Triage Notes (Signed)
 Arrived pov for allergic reaction  Per pt was stung approx 6-8 times by something in the yard.  Swelling and hives noted, tongue swelling noted. Pt is able to talk, took two benadryl  pta but feels as if they are stuck in throat.

## 2024-07-08 NOTE — ED Provider Notes (Signed)
 [facility] Provider Note    None    (approximate)   History   Allergic Reaction   HPI  Kent Floyd is a 53 y.o. male who presents to the ED for evaluation of Allergic Reaction   Review of cardiology clinic visit from May.  History of CAD with stenting x 1 in October, LAD  Patient presents to the ED for evaluation of allergic reaction after being stung by multiple yellow jackets.  Was feeling normal prior to this, was outside doing some yard work when he inadvertently unearthed a Development worker, community and was stung 5-10 times.  Presents to the ED covered with hives, swollen lips, throat closure sensation, abdominal burning sensation and nausea without emesis.   Physical Exam   Triage Vital Signs: ED Triage Vitals  Encounter Vitals Group     BP      Girls Systolic BP Percentile      Girls Diastolic BP Percentile      Boys Systolic BP Percentile      Boys Diastolic BP Percentile      Pulse      Resp      Temp      Temp src      SpO2      Weight      Height      Head Circumference      Peak Flow      Pain Score      Pain Loc      Pain Education      Exclude from Growth Chart     Most recent vital signs: Vitals:   07/08/24 2200 07/08/24 2230  BP: (!) 139/96 (!) 147/91  Pulse: 78 76  Resp: 16 16  Temp:    SpO2: 95% 100%    General: Awake, no distress.  CV:  Good peripheral perfusion.  Resp:  Normal effort.  Abd:  No distention.  MSK:  No deformity noted.  Neuro:  No focal deficits appreciated. Other:  Diffuse hives, mildly swollen lips without upper airway obstruction.  Hoarse voice initially   ED Results / Procedures / Treatments   Labs (all labs ordered are listed, but only abnormal results are displayed) Labs Reviewed - No data to display  EKG Sinus rhythm with a rate of 83 bpm.  Normal axis and intervals without clear signs of acute ischemia.  RADIOLOGY   Official radiology report(s): No results  found.  PROCEDURES and INTERVENTIONS:  .Critical Care  Performed by: Claudene Rover, MD Authorized by: Claudene Rover, MD   Critical care provider statement:    Critical care time (minutes):  45   Critical care time was exclusive of:  Separately billable procedures and treating other patients   Critical care was necessary to treat or prevent imminent or life-threatening deterioration of the following conditions:  Toxidrome   Critical care was time spent personally by me on the following activities:  Development of treatment plan with patient or surrogate, discussions with consultants, evaluation of patient's response to treatment, examination of patient, ordering and review of laboratory studies, ordering and review of radiographic studies, ordering and performing treatments and interventions, pulse oximetry, re-evaluation of patient's condition and review of old charts   Medications  famotidine  (PEPCID ) IVPB 20 mg premix (20 mg Intravenous New Bag/Given 07/08/24 2327)  EPINEPHrine  (ADRENALIN ) 5 mg in NS 250 mL (0.02 mg/mL) premix infusion (has no administration in time range)  EPINEPHrine  (EPI-PEN) injection 0.3 mg (0.3 mg Intramuscular Given 07/08/24 2156)  dexamethasone  (DECADRON ) injection 10 mg (10 mg Intravenous Given 07/08/24 2159)  diphenhydrAMINE  (BENADRYL ) injection 50 mg (50 mg Intravenous Given 07/08/24 2159)  lactated ringers  bolus 1,000 mL (0 mLs Intravenous Stopped 07/08/24 2325)  EPINEPHrine  (EPI-PEN) injection 0.3 mg (0.3 mg Intramuscular Given 07/08/24 2235)     IMPRESSION / MDM / ASSESSMENT AND PLAN / ED COURSE  I reviewed the triage vital signs and the nursing notes.  Differential diagnosis includes, but is not limited to, hives, anaphylaxis, anaphylactic shock, STEMI  {Patient presents with symptoms of an acute illness or injury that is potentially life-threatening.  Patient presents to the ED after being stung by multiple bees with evidence of a fairly severe anaphylactic  reaction ultimately requiring an epinephrine  drip and ICU admission.  He is quickly brought back to a room and provided an EpiPen , supplemented with steroids, Benadryl , IV fluids.  He does not worsen but has only minimal improvement so we provided an additional EpiPen .  Again, minimal improvement and with persistent hives, lip swelling and dry throat sensation.  We will transition to IV epinephrine  infusion and admit.  Clinical Course as of 07/08/24 2350  Sun Jul 08, 2024  2208 reassessed [DS]  2216 reassessed [DS]  2230 On third reassessment he has had minimal improvement, no worsening though.  We provide a second dose of EpiPen  0.3mg .  [DS]  2243 Reassessed, some improvement [DS]  2254 Reassessed.  Mild improvement reports feeling somewhat better and wife acknowledges his color is somewhat better. [DS]  2317 Reassessed.  Only minimally improved, persistent hives, lip swelling.  I recommend an epi drip and admission and he is agreeable. [DS]    Clinical Course User Index [DS] Claudene Rover, MD     FINAL CLINICAL IMPRESSION(S) / ED DIAGNOSES   Final diagnoses:  Anaphylaxis, initial encounter     Rx / DC Orders   ED Discharge Orders     None        Note:  This document was prepared using Dragon voice recognition software and may include unintentional dictation errors.   Claudene Rover, MD 07/08/24 [phone]

## 2024-07-08 NOTE — ED Notes (Signed)
 Pt reports minimal improvement with irritation in throat, pt still flushed to face and extremities but wife reports his legs look a little better, but he doesn't sound better and I'm worried about his BP getting any higher because he is on some medication for it,provider notified and this nurse asked clarification if second dose epi needed

## 2024-07-09 ENCOUNTER — Inpatient Hospital Stay

## 2024-07-09 DIAGNOSIS — I10 Essential (primary) hypertension: Secondary | ICD-10-CM | POA: Diagnosis present

## 2024-07-09 DIAGNOSIS — Z955 Presence of coronary angioplasty implant and graft: Secondary | ICD-10-CM | POA: Diagnosis not present

## 2024-07-09 DIAGNOSIS — T782XXA Anaphylactic shock, unspecified, initial encounter: Secondary | ICD-10-CM | POA: Diagnosis present

## 2024-07-09 DIAGNOSIS — Z79899 Other long term (current) drug therapy: Secondary | ICD-10-CM | POA: Diagnosis not present

## 2024-07-09 DIAGNOSIS — E785 Hyperlipidemia, unspecified: Secondary | ICD-10-CM | POA: Diagnosis present

## 2024-07-09 DIAGNOSIS — T63461A Toxic effect of venom of wasps, accidental (unintentional), initial encounter: Secondary | ICD-10-CM | POA: Diagnosis present

## 2024-07-09 DIAGNOSIS — Z7982 Long term (current) use of aspirin: Secondary | ICD-10-CM | POA: Diagnosis not present

## 2024-07-09 DIAGNOSIS — I25118 Atherosclerotic heart disease of native coronary artery with other forms of angina pectoris: Secondary | ICD-10-CM | POA: Diagnosis not present

## 2024-07-09 DIAGNOSIS — Z7902 Long term (current) use of antithrombotics/antiplatelets: Secondary | ICD-10-CM | POA: Diagnosis not present

## 2024-07-09 DIAGNOSIS — I2510 Atherosclerotic heart disease of native coronary artery without angina pectoris: Secondary | ICD-10-CM | POA: Diagnosis present

## 2024-07-09 LAB — CBC
HCT: 43.8 % (ref 39.0–52.0)
Hemoglobin: 14.5 g/dL (ref 13.0–17.0)
MCH: 29.1 pg (ref 26.0–34.0)
MCHC: 33.1 g/dL (ref 30.0–36.0)
MCV: 88 fL (ref 80.0–100.0)
Platelets: 247 K/uL (ref 150–400)
RBC: 4.98 MIL/uL (ref 4.22–5.81)
RDW: 12.6 % (ref 11.5–15.5)
WBC: 14.5 K/uL — ABNORMAL HIGH (ref 4.0–10.5)
nRBC: 0 % (ref 0.0–0.2)

## 2024-07-09 LAB — COMPREHENSIVE METABOLIC PANEL WITH GFR
ALT: 37 U/L (ref 0–44)
AST: 27 U/L (ref 15–41)
Albumin: 3.7 g/dL (ref 3.5–5.0)
Alkaline Phosphatase: 64 U/L (ref 38–126)
Anion gap: 10 (ref 5–15)
BUN: 14 mg/dL (ref 6–20)
CO2: 25 mmol/L (ref 22–32)
Calcium: 8.9 mg/dL (ref 8.9–10.3)
Chloride: 105 mmol/L (ref 98–111)
Creatinine, Ser: 1.11 mg/dL (ref 0.61–1.24)
GFR, Estimated: >60 mL/min (ref >60)
Glucose, Bld: 188 mg/dL — ABNORMAL HIGH (ref 70–99)
Potassium: 3.3 mmol/L — ABNORMAL LOW (ref 3.5–5.1)
Sodium: 140 mmol/L (ref 135–145)
Total Bilirubin: 1 mg/dL (ref 0.0–1.2)
Total Protein: 6.6 g/dL (ref 6.5–8.1)

## 2024-07-09 LAB — PROTIME-INR
INR: 1.2 (ref 0.8–1.2)
Prothrombin Time: 15.7 s — ABNORMAL HIGH (ref 11.4–15.2)

## 2024-07-09 LAB — SEDIMENTATION RATE: Sed Rate: 2 mm/h (ref 0–20)

## 2024-07-09 LAB — MRSA NEXT GEN BY PCR, NASAL: MRSA by PCR Next Gen: NOT DETECTED

## 2024-07-09 LAB — GLUCOSE, CAPILLARY: Glucose-Capillary: 204 mg/dL — ABNORMAL HIGH (ref 70–99)

## 2024-07-09 MED ORDER — ISOSORBIDE MONONITRATE ER 30 MG PO TB24
15.0000 mg | ORAL_TABLET | Freq: Every day | ORAL | Status: DC
  Administered 2024-07-09: 15 mg via ORAL
  Filled 2024-07-09 (×2): qty 1

## 2024-07-09 MED ORDER — ASPIRIN 81 MG PO TBEC
81.0000 mg | DELAYED_RELEASE_TABLET | Freq: Every day | ORAL | Status: DC
  Administered 2024-07-09: 81 mg via ORAL
  Filled 2024-07-09: qty 1

## 2024-07-09 MED ORDER — EPINEPHRINE 0.3 MG/0.3ML IJ SOAJ: 0.3000 mg | INTRAMUSCULAR | 5 refills | Status: AC | PRN

## 2024-07-09 MED ORDER — ISOSORBIDE MONONITRATE ER 30 MG PO TB24: 15.0000 mg | ORAL_TABLET | Freq: Every day | ORAL | Status: DC

## 2024-07-09 MED ORDER — ATORVASTATIN CALCIUM 20 MG PO TABS
40.0000 mg | ORAL_TABLET | Freq: Every day | ORAL | Status: DC
  Administered 2024-07-09: 40 mg via ORAL
  Filled 2024-07-09: qty 2

## 2024-07-09 MED ORDER — TICAGRELOR 90 MG PO TABS: 90.0000 mg | ORAL_TABLET | Freq: Two times a day (BID) | ORAL | Status: DC

## 2024-07-09 MED ORDER — ENOXAPARIN SODIUM 40 MG/0.4ML IJ SOSY: 40.0000 mg | PREFILLED_SYRINGE | INTRAMUSCULAR | Status: DC

## 2024-07-09 MED ORDER — TICAGRELOR 90 MG PO TABS
90.0000 mg | ORAL_TABLET | Freq: Two times a day (BID) | ORAL | Status: DC
Start: 2024-07-09 — End: 2024-07-09
  Administered 2024-07-09: 90 mg via ORAL
  Filled 2024-07-09 (×2): qty 1

## 2024-07-09 MED ORDER — ISOSORBIDE MONONITRATE ER 30 MG PO TB24: 30.0000 mg | ORAL_TABLET | Freq: Every day | ORAL | 3 refills | Status: DC

## 2024-07-09 MED ORDER — DOCUSATE SODIUM 100 MG PO CAPS
100.0000 mg | ORAL_CAPSULE | Freq: Two times a day (BID) | ORAL | Status: DC | PRN
Start: 2024-07-09 — End: 2024-07-09

## 2024-07-09 MED ORDER — POLYETHYLENE GLYCOL 3350 17 G PO PACK: 17.0000 g | PACK | Freq: Every day | ORAL | Status: DC | PRN

## 2024-07-09 MED ORDER — CARVEDILOL 12.5 MG PO TABS
12.5000 mg | ORAL_TABLET | Freq: Two times a day (BID) | ORAL | Status: DC
  Filled 2024-07-09: qty 1

## 2024-07-09 MED ORDER — NITROGLYCERIN 0.4 MG SL SUBL: 0.4000 mg | SUBLINGUAL_TABLET | SUBLINGUAL | Status: DC | PRN

## 2024-07-09 NOTE — ED Notes (Signed)
 Pt given urinal and assisted to stand side of bed, steady gait NAD

## 2024-07-09 NOTE — Consult Note (Signed)
 Cardiology Consultation   Patient ID: Kent Floyd MRN: [id number]; DOB: 04/10/1971  Admit date: 07/08/2024 Date of Consult: 07/09/2024  PCP:  Freddrick Johns   [facility] Providers Cardiologist:  Darryle ONEIDA Decent, MD  Cardiology APP:  Gerard Frederick, NP       Patient Profile: Kent Floyd is a 53 y.o. male with a hx of CAD s/p DES to the LAD 09/2023, hypertension, and hyperlipidemia who is being seen 07/09/2024 for the evaluation of anaphylactic shock at the request of Almarie Nose, NP.  History of Present Illness: Kent Floyd presented to the ED yesterday 7/20 evening after he was trimming some bushes in his yard and was stung 5-10 times by yellow jackets. He was feeling fine for some time after but subsequently started to notice pruritus of his hands and feet. This progressively worsening and he additionally noted swelling of his lips and lower extremities with a sensation of his throat tightening. He took benadryl  at home without improvement of symptoms and decided to report to the ED. In the ED, BP 139/96 with otherwise normal vital signs. Labs remarkable for K 3.3 and WBC 14.5. CXR unremarkable. He was given Epi injection, dexamethasone  injection, and diphenhydramine  injection with little improvement in symptoms. Given additional epi injection with no further improvement and subsequently started on epi drip. Cardiology was consulted overnight to discuss holding antiplatelet therapy. At time of cardiology consult, patient reports improvements in symptoms. He denies chest pain and shortness of breath. Last dose of Brilinta  was 7/20 AM.   Past Medical History:  Diagnosis Date   Headache     Past Surgical History:  Procedure Laterality Date   COLONOSCOPY WITH PROPOFOL  N/A 10/06/2020   Procedure: COLONOSCOPY WITH PROPOFOL ;  Surgeon: Therisa Bi, MD;  Location: [facility] ENDOSCOPY;  Service: Gastroenterology;  Laterality: N/A;   CORONARY STENT INTERVENTION N/A 10/17/2023    Procedure: CORONARY STENT INTERVENTION;  Surgeon: Anner Alm ORN, MD;  Location: [facility] INVASIVE CV LAB;  Service: Cardiovascular;  Laterality: N/A;   DUPUYTREN CONTRACTURE RELEASE Left 12/04/2020   Procedure: FASCIECTOMY LEFT SMALL FINGER;  Surgeon: Murrell Kuba, MD;  Location: [facility];  Service: Orthopedics;  Laterality: Left;  AXILLARY BLOCK   ESOPHAGOGASTRODUODENOSCOPY (EGD) WITH PROPOFOL  N/A 10/06/2020   Procedure: ESOPHAGOGASTRODUODENOSCOPY (EGD) WITH PROPOFOL ;  Surgeon: Therisa Bi, MD;  Location: [facility] ENDOSCOPY;  Service: Gastroenterology;  Laterality: N/A;   LEFT HEART CATH AND CORONARY ANGIOGRAPHY N/A 10/17/2023   Procedure: LEFT HEART CATH AND CORONARY ANGIOGRAPHY;  Surgeon: Anner Alm ORN, MD;  Location: [facility] INVASIVE CV LAB;  Service: Cardiovascular;  Laterality: N/A;       Scheduled Meds:  aspirin  EC  81 mg Oral Daily   atorvastatin   40 mg Oral Daily   [START ON 07/10/2024] enoxaparin  (LOVENOX ) injection  40 mg Subcutaneous Q24H   isosorbide  mononitrate  15 mg Oral Daily   ticagrelor   90 mg Oral BID   Continuous Infusions:  epinephrine  Stopped (07/09/24 0600)   PRN Meds: docusate sodium , nitroGLYCERIN , polyethylene glycol  Allergies:    Allergies  Allergen Reactions   Bee Venom Anaphylaxis   Tree Extract Other (See Comments)    Throat itching    Social History:   Social History   Socioeconomic History   Marital status: Married    Spouse name: Not on file   Number of children: 2   Years of education: college   Highest education level: Bachelor's degree (e.g., BA, AB, BS)  Occupational History   Occupation: Charity fundraiser  Tobacco  Use   Smoking status: Never   Smokeless tobacco: Never  Vaping Use   Vaping status: Never Used  Substance and Sexual Activity   Alcohol use: Yes    Alcohol/week: 0.0 standard drinks of alcohol    Comment: 2 drinks per month   Drug use: No   Sexual activity: Not on file  Other Topics Concern   Not on file  Social  History Narrative   Lives at home with his wife and child.   Right-handed.   Caffeine per day: cup of coffee in morning, glass of tea at lunch and dinner   Social Drivers of Health   Financial Resource Strain: Not on file  Food Insecurity: No Food Insecurity (07/09/2024)   Hunger Vital Sign    Worried About Running Out of Food in the Last Year: Never true    Ran Out of Food in the Last Year: Never true  Transportation Needs: No Transportation Needs (07/09/2024)   PRAPARE - Administrator, Civil Service (Medical): No    Lack of Transportation (Non-Medical): No  Physical Activity: Not on file  Stress: Not on file  Social Connections: Not on file  Intimate Partner Violence: Not At Risk (07/09/2024)   Humiliation, Afraid, Rape, and Kick questionnaire    Fear of Current or Ex-Partner: No    Emotionally Abused: No    Physically Abused: No    Sexually Abused: No    Family History:    Family History  Problem Relation Age of Onset   Healthy Mother    Skin cancer Father      ROS:  Please see the history of present illness.   All other ROS reviewed and negative.     Physical Exam/Data: Vitals:   07/09/24 0600 07/09/24 0630 07/09/24 0712 07/09/24 0730  BP: 121/63 118/66 125/74   Pulse: 70 65 73   Resp: (!) 26 14 14    Temp:    98.1 F (36.7 C)  TempSrc:    Oral  SpO2: 95% 97% 97%   Weight:      Height:        Intake/Output Summary (Last 24 hours) at 07/09/2024 0853 Last data filed at 07/09/2024 0601 Gross per 24 hour  Intake 1109.26 ml  Output --  Net 1109.26 ml      07/09/2024    2:17 AM 07/08/2024    9:54 PM 07/05/2024    2:38 PM  Last 3 Weights  Weight (lbs) 218 lb 7.6 oz 215 lb 214 lb 8 oz  Weight (kg) 99.1 kg 97.523 kg 97.297 kg     Body mass index is 28.05 kg/m.  General:  Well nourished, well developed, in no acute distress HEENT: normal Neck: no JVD Vascular: No carotid bruits; Distal pulses 2+ bilaterally Cardiac:  normal S1, S2; RRR; no  murmur  Lungs:  clear to auscultation bilaterally, no wheezing, rhonchi or rales  Abd: soft, nontender, no hepatomegaly  Ext: no edema Skin: warm and dry  Psych:  Normal affect   EKG:  The EKG was personally reviewed and demonstrates:  sinus rhythm, rate 83 bpm Telemetry:  Telemetry was personally reviewed and demonstrates:  sinus rhythm  Relevant CV Studies:  10/17/2023 LHC Left dominant system with severe single-vessel disease-80% stenosis in the proximal LAD (at small 1st Diag/SP1). Successful DES PCI with Synergy DES 3.5 x 16 mm -> post-dilated to 4.1 mm.  (Lesion reduced to 0% with TIMI-3 flow maintained) Otherwise 50% proximal ramus intermedius and 30% proximal  dominant LCx disease but no significant stenosis. Normal LVEDP with normal EF by Echo.  10/15/2024 Echo complete 1. Left ventricular ejection fraction, by estimation, is 60 to 65%. The left ventricle has normal function. The left ventricle has no regional wall motion abnormalities. There is mild left ventricular hypertrophy. Left ventricular diastolic parameters  were normal.   2. Right ventricular systolic function is normal. The right ventricular size is normal. Tricuspid regurgitation signal is inadequate for assessing PA pressure.   3. The mitral valve is normal in structure. No evidence of mitral valve regurgitation.   4. The aortic valve is tricuspid. Aortic valve regurgitation is not visualized. No aortic stenosis is present.   5. The inferior vena cava is normal in size with greater than 50% respiratory variability, suggesting right atrial pressure of 3 mmHg.   Laboratory Data: High Sensitivity Troponin:   Recent Labs  Lab 07/05/24 1542  TROPONINIHS 3     Chemistry Recent Labs  Lab 07/05/24 1542 07/09/24 0131  NA 139 140  K 4.0 3.3*  CL 105 105  CO2 26 25  GLUCOSE 110* 188*  BUN 13 14  CREATININE 1.01 1.11  CALCIUM  9.2 8.9  GFRNONAA >60 >60  ANIONGAP 8 10    Recent Labs  Lab 07/09/24 0131  PROT  6.6  ALBUMIN 3.7  AST 27  ALT 37  ALKPHOS 64  BILITOT 1.0   Lipids No results for input(s): CHOL, TRIG, HDL, LABVLDL, LDLCALC, CHOLHDL in the last 168 hours.  Hematology Recent Labs  Lab 07/05/24 1542 07/09/24 0131  WBC 8.1 14.5*  RBC 5.11 4.98  HGB 14.7 14.5  HCT 44.3 43.8  MCV 86.7 88.0  MCH 28.8 29.1  MCHC 33.2 33.1  RDW 12.7 12.6  PLT 249 247   Thyroid No results for input(s): TSH, FREET4 in the last 168 hours.  BNPNo results for input(s): BNP, PROBNP in the last 168 hours.  DDimer No results for input(s): DDIMER in the last 168 hours.  Radiology/Studies:  DG Chest 1 View Result Date: 07/09/2024 IMPRESSION: 1. No acute process. 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 - Stung by yellow jackets 5-10 times with symptoms of hives, lip swelling, throat tightness, and pruritus of the hands/feet - Given epi injection x 2 with dexamethasone , benadryl , and famotidine  with minimal improvement and subsequently started on epinephrine  drip - Symptoms continue to improve, now off epi drip - Would recommend discontinuing carvedilol  given potential future need for epinephrine  injections and drug interaction  CAD s/p DES 09/2023 - Patient denies chest pain - EKG without acute ischemic changes - Brilinta  was held yesterday evening, will resume today - Discontinue carvedilol  as above - Would recommend increasing Imdur  to 30 mg daily for BP management given discontinuation of BB  Hypertension - BP stable - Discontinue carvedilol  and increase Imdur  as above   For questions or updates, please contact [facility] Please consult [email or web address] for contact info under    Signed, Lesley LITTIE Maffucci, PA-C  07/09/2024 8:53 AM

## 2024-07-09 NOTE — ED Notes (Signed)
 Report called to Abby, RN, in ICU

## 2024-07-09 NOTE — Progress Notes (Signed)
 eLink Physician-Brief Progress Note Patient Name: Kent Floyd DOB: [birth year] MRN: [id number]   Date of Service  07/09/2024  HPI/Events of Note  53 year old male who presented to the ED with anaphylaxis after getting stung by bees.  Treated with Benadryl , famotidine , dexamethasone  and epinephrine  with minimal improvement.  After treatment was repeated 3 times with minimal improvement, he was admitted to the ICU for further management.  eICU Interventions  Patient's chart reviewed.  Pertinent labs and imaging studies reviewed.  Video assessment of patient done.  He states that his rash is much better. Impression: Anaphylactic reaction to bees. Continue supportive care.     Intervention Category Evaluation Type: New Patient Evaluation  Jerilynn Berg 07/09/2024, 3:31 AM

## 2024-07-09 NOTE — Discharge Summary (Addendum)
 Physician Discharge Summary  Patient ID: Kent Floyd MRN: [id number] DOB/AGE: 04/05/1971 53 y.o.  Admit date: 07/08/2024 Discharge date: 07/09/2024  Admission Diagnoses:ANAPHYLACTIC SHOCK DUE TO YELLOW JACKET STING  Discharge Diagnoses:  Principal Problem:   Anaphylactic reaction   Discharged Condition: stable  Hospital Course: ADMITTED TO ICU FOR MONITORING FOR ANAPHYLACTIC SHOCK, PATIENT IS DEMANDING TO GO HOME    Treatments: EPI DRIP  Discharge Exam: Blood pressure 118/66, pulse 65, temperature 99 F (37.2 C), temperature source Oral, resp. rate 14, height 6' 2 (1.88 m), weight 99.1 kg, SpO2 97%.     Review of Systems: Gen:  Denies  fever, sweats, chills weight loss  HEENT: Denies blurred vision, double vision, ear pain, eye pain, hearing loss, nose bleeds, sore throat Cardiac:  No dizziness, chest pain or heaviness, chest tightness,edema, No JVD Resp:   No cough, -sputum production, -shortness of breath,-wheezing, -hemoptysis,  Other:  All other systems negative   Physical Examination:   General Appearance: No distress  EYES PERRLA, EOM intact.   NECK Supple, No JVD Pulmonary: normal breath sounds, No wheezing.  CardiovascularNormal S1,S2.  No m/r/g.   Abdomen: Benign, Soft, non-tender. Neurology UE/LE 5/5 strength, no focal deficits Ext pulses intact, cap refill intact ALL OTHER ROS ARE NEGATIVE  Disposition: HOME   Allergies as of 07/09/2024       Reactions   Bee Venom Anaphylaxis   Tree Extract Other (See Comments)   Throat itching        Medication List     STOP taking these medications    carvedilol  12.5 MG tablet Commonly known as: COREG        TAKE these medications    aspirin  EC 81 MG tablet Take 1 tablet (81 mg total) by mouth daily. Swallow whole.   atorvastatin  40 MG tablet Commonly known as: LIPITOR Take 1 tablet (40 mg total) by mouth daily.   EPINEPHrine  0.3 mg/0.3 mL Soaj injection Commonly known as:  EPI-PEN Inject 0.3 mg into the muscle as needed for anaphylaxis.   isosorbide  mononitrate 30 MG 24 hr tablet Commonly known as: IMDUR  Take 1 tablet (30 mg total) by mouth daily. What changed: how much to take   nitroGLYCERIN  0.4 MG SL tablet Commonly known as: NITROSTAT  Place 1 tablet (0.4 mg total) under the tongue every 5 (five) minutes x 3 doses as needed for chest pain.   ticagrelor  90 MG Tabs tablet Commonly known as: BRILINTA  Take 1 tablet (90 mg total) by mouth 2 (two) times daily.       EPI PEN AS NEEDED STOP TAKING CARVEDILOL  PER CARDIOLOGY IMDUR  INCREASED TO 30 MG   Signed: Ronniesha Seibold 07/09/2024, 7:04 AM

## 2024-07-09 NOTE — H&P (Addendum)
 NAME:  Kent Floyd, MRN:  [id number], DOB:  22-Jan-1971, LOS: 0 ADMISSION DATE:  07/08/2024, CONSULTATION DATE: 07/09/2024 REFERRING MD: Claudene Rover, CHIEF COMPLAINT: Anaphylactic reaction   HPI  53 y.o male with significant PMH of CAD, STEMI s/p drug eluding stent to prox LAD, HTN, and HLD who presented to the ED with chief complaints of anaphylactic reaction due to bee sting.  Per ED reports, patient was outside doing some yard work when he inadvertently unearthed a yellow jacket nest and was stung 5-10 times.  Patient reports feeling normal prior to this but few minutes later he developed localized nonpitting swelling in his extremities, face, throat and lips.he presented to the ED with S/Sx: angioedema, urticaria/hives, flushing, abdominal burning sensation, and nausea without vomiting.  Patient denied dyspnea, wheezing, or chest pain although he felt throat closure sensation  ED Course: Initial vital signs showed HR of 78 beats/minute, BP 139/96 mm Hg, the RR 16 breaths/minute, and the oxygen saturation 95% on RA nd a temperature of 98.8F (36.9C). No Pertinent Labs/Diagnostics Findings available on admission.   Patient was immediately treated with epi injection 0.3 mg IM, dexamethasone  10 mg IV, Benadryl  50 mg IV and famotidine  IVPB 20 mg.  On reassessment he was noted with minimal improvement and received a second dose of EpiPen  0.3 mg.  Patient was reassessed at that time and was noted only with minimal improvement, persistent hives, lip swelling and irritation so he was started on epinephrine  drip.  PCCM consulted for admission and close monitoring  Past Medical History  CAD, STEMI s/p drug eluding stent to prox LAD, HTN, and HLD   Significant Hospital Events   7/21: Admitted to ICU with anaphylactic reaction secondary to bee sting requiring Epi drip  Consults:  PCCM  Procedures:  None  Interim History / Subjective:    -  Micro Data:  7/2: MRSA PCR>>   Antimicrobials:   None  OBJECTIVE  Blood pressure (!) 152/91, pulse 88, temperature 99 F (37.2 C), temperature source Oral, resp. rate (!) 22, height 6' 2 (1.88 m), weight 97.5 kg, SpO2 93%.        Intake/Output Summary (Last 24 hours) at 07/09/2024 0313 Last data filed at 07/09/2024 0308 Gross per 24 hour  Intake 1090.78 ml  Output --  Net 1090.78 ml   Filed Weights   07/08/24 2154  Weight: 97.5 kg   Physical Examination  GENERAL: 53 year-old critically ill patient lying in the bed  EYES: PEERLA. No scleral icterus. Extraocular muscles intact.  HEENT: Head atraumatic, normocephalic. Oropharynx and nasopharynx clear.  Mild lip swelling CARDIOVASCULAR:S1, S2 normal. No murmurs, rubs, or gallops.   PULMONARY: Normal breath sounds ABDOMINAL: Soft, NTND MUSCULOSKELETAL: Bilateral hand swelling. Normal range of motion.  NEUROLOGICAL: General: No focal deficit present. She is alert and oriented to person, place, and time baseline.  PSYCHIATRIC:  Mood and Affect: Mood normal.  SKIN:Skin is warm and dry. No obvious rash, lesion, or ulcer. Capillary Refill:>2sec Labs/imaging that I havepersonally reviewed  (right click and Reselect all SmartList Selections daily)  DG Chest 1 View Result Date: 07/09/2024 EXAM: 1 VIEW XRAY OF THE CHEST 07/09/2024 01:57:55 AM COMPARISON: None available. CLINICAL HISTORY: PER ER NOTE; Arrived pov for allergic reaction; ; Per pt was stung approx 6-8 times by something in the yard.; ; Swelling and hives noted, tongue swelling noted. Pt is able to talk, took two benadryl  pta but feels as if they are stuck in throat. FINDINGS: LUNGS AND PLEURA: No  focal pulmonary opacity. No pulmonary edema. Mild blunting of the left costophrenic angle without convincing pleural effusion. No pneumothorax. HEART AND MEDIASTINUM: No acute abnormality of the cardiac and mediastinal silhouettes. BONES AND SOFT TISSUES: No acute osseous abnormality. IMPRESSION: 1. No acute process. Electronically  signed by: Pinkie Pebbles MD 07/09/2024 02:00 AM EDT RP Workstation: [id number]    Labs   CBC: Recent Labs  Lab 07/05/24 1542 07/09/24 0131  WBC 8.1 14.5*  HGB 14.7 14.5  HCT 44.3 43.8  MCV 86.7 88.0  PLT 249 247   Basic Metabolic Panel: Recent Labs  Lab 07/05/24 1542 07/09/24 0131  NA 139 140  K 4.0 3.3*  CL 105 105  CO2 26 25  GLUCOSE 110* 188*  BUN 13 14  CREATININE 1.01 1.11  CALCIUM  9.2 8.9   GFR: Estimated Creatinine Clearance: 90.5 mL/min (by C-G formula based on SCr of 1.11 mg/dL). Recent Labs  Lab 07/05/24 1542 07/09/24 0131  WBC 8.1 14.5*   Liver Function Tests: Recent Labs  Lab 07/09/24 0131  AST 27  ALT 37  ALKPHOS 64  BILITOT 1.0  PROT 6.6  ALBUMIN 3.7   No results for input(s): LIPASE, AMYLASE in the last 168 hours. No results for input(s): AMMONIA in the last 168 hours.  ABG    Component Value Date/Time   TCO2 26 08/05/2017 1118    Coagulation Profile: Recent Labs  Lab 07/09/24 0131  INR 1.2    Cardiac Enzymes: No results for input(s): CKTOTAL, CKMB, CKMBINDEX, TROPONINI in the last 168 hours.  HbA1C: Hgb A1c MFr Bld  Date/Time Value Ref Range Status  10/17/2023 06:45 AM 5.6 4.8 - 5.6 % Final    Comment:    (NOTE) Pre diabetes:          5.7%-6.4%  Diabetes:              >6.4%  Glycemic control for   <7.0% adults with diabetes    CBG: Recent Labs  Lab 07/09/24 0220  GLUCAP 204*    Review of Systems:   Review of Systems  Constitutional: Negative.   HENT: Negative.    Eyes: Negative.   Respiratory: Negative.    Cardiovascular: Negative.   Gastrointestinal:  Positive for abdominal pain and nausea.  Genitourinary: Negative.   Musculoskeletal: Negative.   Skin:  Positive for itching.  Neurological:  Positive for sensory change and speech change.  Endo/Heme/Allergies: Negative.   Psychiatric/Behavioral: Negative.     Past Medical History  He,  has a past medical history of Headache.    Surgical History    Past Surgical History:  Procedure Laterality Date   COLONOSCOPY WITH PROPOFOL  N/A 10/06/2020   Procedure: COLONOSCOPY WITH PROPOFOL ;  Surgeon: Therisa Bi, MD;  Location: [facility] ENDOSCOPY;  Service: Gastroenterology;  Laterality: N/A;   CORONARY STENT INTERVENTION N/A 10/17/2023   Procedure: CORONARY STENT INTERVENTION;  Surgeon: Anner Alm ORN, MD;  Location: [facility] INVASIVE CV LAB;  Service: Cardiovascular;  Laterality: N/A;   DUPUYTREN CONTRACTURE RELEASE Left 12/04/2020   Procedure: FASCIECTOMY LEFT SMALL FINGER;  Surgeon: Murrell Kuba, MD;  Location: [facility];  Service: Orthopedics;  Laterality: Left;  AXILLARY BLOCK   ESOPHAGOGASTRODUODENOSCOPY (EGD) WITH PROPOFOL  N/A 10/06/2020   Procedure: ESOPHAGOGASTRODUODENOSCOPY (EGD) WITH PROPOFOL ;  Surgeon: Therisa Bi, MD;  Location: [facility] ENDOSCOPY;  Service: Gastroenterology;  Laterality: N/A;   LEFT HEART CATH AND CORONARY ANGIOGRAPHY N/A 10/17/2023   Procedure: LEFT HEART CATH AND CORONARY ANGIOGRAPHY;  Surgeon: Anner Alm ORN, MD;  Location: [facility] INVASIVE CV LAB;  Service: Cardiovascular;  Laterality: N/A;     Social History   reports that he has never smoked. He has never used smokeless tobacco. He reports current alcohol use. He reports that he does not use drugs.   Family History   His family history includes Healthy in his mother; Skin cancer in his father.   Allergies Allergies  Allergen Reactions   Bee Venom Anaphylaxis   Tree Extract Other (See Comments)    Throat itching   Home Medications  Prior to Admission medications   Medication Sig Start Date End Date Taking? Authorizing Provider  aspirin  EC 81 MG tablet Take 1 tablet (81 mg total) by mouth daily. Swallow whole. 11/01/23  Yes Hammock, Sheri, NP  atorvastatin  (LIPITOR) 40 MG tablet Take 1 tablet (40 mg total) by mouth daily. 05/16/24  Yes Hammock, Tylene, NP  carvedilol  (COREG ) 12.5 MG tablet Take 1 tablet (12.5 mg total) by mouth 2  (two) times daily with a meal. 05/16/24  Yes Hammock, Sheri, NP  isosorbide  mononitrate (IMDUR ) 30 MG 24 hr tablet Take 0.5 tablets (15 mg total) by mouth daily. 05/16/24  Yes Hammock, Sheri, NP  nitroGLYCERIN  (NITROSTAT ) 0.4 MG SL tablet Place 1 tablet (0.4 mg total) under the tongue every 5 (five) minutes x 3 doses as needed for chest pain. 10/18/23  Yes Henry Manuelita NOVAK, NP  ticagrelor  (BRILINTA ) 90 MG TABS tablet Take 1 tablet (90 mg total) by mouth 2 (two) times daily. 11/01/23  Yes Gerard Tylene, NP  Scheduled Meds:  [START ON 07/10/2024] enoxaparin  (LOVENOX ) injection  40 mg Subcutaneous Q24H   Continuous Infusions:  epinephrine  5 mcg/min (07/09/24 0308)   PRN Meds:.docusate sodium , polyethylene glycol  Active Hospital Problem list   See systems below  Assessment & Plan:  #Anaphylaxis due to stinging insects Patient report being stung 6-8 times with yellow jackets Initial treatment with IM Epinephrine  0.3mg  IM x2, Dexamethasone  10mg  IV. Famotidine  20 mg IVPB and Diphenhydramine  50 mg IV -Supportive care -IVFs -CBC w diff, BMP, LFTs, CRP, ESR, C4 level, tryptase -Start Epinephrine  gtt @ 5-48mcg/min -Duonebs PRN wheezing/cough/SOB -Will need EpiPen  & refer to Allergy at discharge for skin testing. If +, consider SQ venom immunotherapy   #HTN #HLD #Hx of CAD, STEMI s/p drug eluding stent to prox LAD, -check troponin -Continue Aspirin  81 mg -Continue Atorvastatin  40 mg -Continue Coreg , Imdur , ticagrelor  and as needed nitro once able to take.    Best practice:  Diet:  NPO Pain/Anxiety/Delirium protocol (if indicated): No VAP protocol (if indicated): Not indicated DVT prophylaxis: LMWH GI prophylaxis: N/A Glucose control:  SSI No Central venous access:  N/A Arterial line:  N/A Foley:  N/A Mobility:  bed rest  PT consulted: N/A Last date of multidisciplinary goals of care discussion []  Code Status:  full code Disposition: ICU   = Goals of Care = Code Status Order:  ICU  Primary Emergency Contact: Maybee,Jennifer, Home Phone: [phone] Wishes to pursue full aggressive treatment and intervention options, including CPR and intubation, but goals of care will be addressed on going with family if that should become necessary.  Critical care time: 45 minutes        Almarie Nose DNP, CCRN, FNP-C, AGACNP-BC Acute Care & Family Nurse Practitioner [facility] PCCM on call pager [phone]

## 2024-07-10 LAB — HIGH SENSITIVITY CRP: CRP, High Sensitivity: 0.4 mg/L (ref 0.00–3.00)

## 2024-07-10 LAB — C4 COMPLEMENT: Complement C4, Body Fluid: 14 mg/dL (ref 12–38)

## 2024-07-11 LAB — TRYPTASE: Tryptase: 37.7 ug/L — ABNORMAL HIGH (ref 2.2–13.2)

## 2024-07-26 ENCOUNTER — Telehealth: Payer: Self-pay

## 2024-07-26 DIAGNOSIS — R072 Precordial pain: Secondary | ICD-10-CM

## 2024-07-26 NOTE — Telephone Encounter (Signed)
 ----- Message from [facility] sent at 07/26/2024  4:08 PM EDT ----- Regarding: RE: SHARA DAUB He can do an exercise myoview. A plain treadmill would not show if there was any blockage just possible ischemia if EKG changes. We typically do not to POET for chest discomfort in someone who has CAD because there is no imaging involved. ----- Message ----- From: Maudine Jon BIRCH, RN Sent: 07/26/2024   3:57 PM EDT To: Tylene Lunch, NP Subject: RE: SHARA DAUB                                Called the patient, told him he would have to call insurance to find out exact reason for denial - he did ask about a traditional treadmill test - told him I would ask you ----- Message ----- From: Lunch Tylene, NP Sent: 07/26/2024   3:37 PM EDT To: Jon BIRCH Maudine, RN Subject: FWBETHA SHARA DAUB                                Will you see if he is agreeable to a Lexiscan Myoview and change the test to that versus the PET stress? ----- Message ----- From: Hayden Gale HERO Sent: 07/26/2024  11:52 AM EDT To: Tylene Lunch, NP; Darryle Debby Decent, MD;# Subject: SHARA DAUB [place] morning,   Sherleen has denied auth for PET scan.  Katelyn, are you able to take pt off the schedule until we find a resolve?  Following are the details.   We received your request to cover the following service(s): [phone] Myocardial (heart) imaging, positron emission tomography with computed tomography transmission (PET/CT) perfusion study, is a type of picture study that looks at blood supply to the heart under stress conditions (similar to exercise) or at rest.  After reviewing the information we have, we've determined that this service is not covered. This letter explains why. It describes your right to ask for another review. It also describes the steps you or your health care professional can take to make that request.  Why: We reviewed information from Dr. Darryle Decent, your benefit  plan, and any policies and guidelines needed to reach this decision. We found the service requested is not medically necessary in your case: Your doctor told us  that there is a concern related to the blood vessels in your heart. The request cannot be approved because:  Imaging can be done if you have one of these conditions. -You have large breasts or implants. -You are unable to exercise enough to reach a target heart rate due to a physical problem. -One of the other conditions listed in this guideline. The details sent to us  do not describe one of these conditions. This finding was based on review of [employer] Cardiac Imaging Guidelines Section(s): Stress Testing with Imaging (CD 1.4). This request was reviewed by a board certified physician: Clinical Peer Reviewer - [phone] / Title: Associate Medical Director / Specialties: Internal Medicine and Cardiovascular Disease  If you are the requesting physician, and you would like to request a peer-to-peer conversation, call our Health Services Department at [phone] to discuss this decision with a physician reviewer. Reference# [id number].  Thanks,  Gale

## 2024-07-26 NOTE — Telephone Encounter (Signed)
 Called patient to advise that insurance had denied PET - change in order per Tylene Lunch, NP to exercise Myoview - order entered and instructions sent to patient - scheduling to call to set up appointment

## 2024-08-02 ENCOUNTER — Ambulatory Visit

## 2024-08-10 ENCOUNTER — Telehealth: Payer: Self-pay | Admitting: Cardiology

## 2024-08-10 NOTE — Telephone Encounter (Signed)
 Pt called in asking if there is any update on possibly submitting an appeal for denied PET scan. He stated in a previous note that [employer] needed additional clinical notes.

## 2024-08-14 ENCOUNTER — Ambulatory Visit
Admission: RE | Admit: 2024-08-14 | Discharge: 2024-08-14 | Disposition: A | Discharge status: Home / Self Care | Source: Ambulatory Visit | Attending: Cardiology | Admitting: Cardiology

## 2024-08-14 ENCOUNTER — Other Ambulatory Visit: Payer: Self-pay | Admitting: Physician Assistant

## 2024-08-14 DIAGNOSIS — R072 Precordial pain: Secondary | ICD-10-CM | POA: Insufficient documentation

## 2024-08-14 MED ORDER — TECHNETIUM TC 99M TETROFOSMIN IV KIT
31.6200 | PACK | Freq: Once | INTRAVENOUS | Status: AC | PRN
  Administered 2024-08-14: 31.62 via INTRAVENOUS

## 2024-08-14 MED ORDER — TECHNETIUM TC 99M TETROFOSMIN IV KIT
10.2500 | PACK | Freq: Once | INTRAVENOUS | Status: AC | PRN
  Administered 2024-08-14: 10.25 via INTRAVENOUS

## 2024-08-14 NOTE — Progress Notes (Signed)
     Kent Floyd presented for a nuclear stress test today.  I Lesley LITTIE Maffucci, PA-C, provided direct supervision and was present during the stress portion of the study today, which was completed without significant symptoms, immediate complications, or acute ST/T changes on ECG.  Stress imaging is pending at this time.  Preliminary ECG findings may be listed in the chart, but the stress test result will not be finalized until perfusion imaging is complete.  Lesley LITTIE Maffucci, PA-C  08/14/2024, 10:39 AM

## 2024-08-15 ENCOUNTER — Ambulatory Visit: Payer: Self-pay | Admitting: Cardiology

## 2024-08-15 LAB — NM MYOCAR MULTI W/SPECT W/WALL MOTION / EF
Angina Index: 0
Duke Treadmill Score: 10
Estimated workload: 12.5
Exercise duration (min): 10 min
Exercise duration (sec): 28 s
LV dias vol: 90 mL (ref 62–150)
LV sys vol: 27 mL (ref 4.2–5.8)
MPHR: 168 {beats}/min
Nuc Stress EF: 68 %
Peak HR: 160 {beats}/min
Percent HR: 95 %
Rest HR: 59 {beats}/min
Rest Nuclear Isotope Dose: 10.3 mCi
SDS: 2
SRS: 5
SSS: 2
ST Depression (mm): 0 mm
Stress Nuclear Isotope Dose: 31.6 mCi
TID: 78

## 2024-08-15 NOTE — Progress Notes (Signed)
 Normal exercise myocardial stress test without evidence of ischemia or scar.  Study is considered low risk.

## 2024-10-19 ENCOUNTER — Other Ambulatory Visit: Payer: Self-pay | Admitting: Cardiology

## 2024-10-25 ENCOUNTER — Encounter: Payer: Self-pay | Admitting: Cardiology

## 2024-10-25 ENCOUNTER — Ambulatory Visit: Admitting: Cardiology

## 2024-10-25 ENCOUNTER — Ambulatory Visit: Attending: Cardiology | Admitting: Cardiology

## 2024-10-25 VITALS — BP 120/86 | HR 78 | Ht 74.0 in | Wt 218.0 lb

## 2024-10-25 DIAGNOSIS — Z87892 Personal history of anaphylaxis: Secondary | ICD-10-CM | POA: Diagnosis not present

## 2024-10-25 DIAGNOSIS — I25118 Atherosclerotic heart disease of native coronary artery with other forms of angina pectoris: Secondary | ICD-10-CM

## 2024-10-25 DIAGNOSIS — I10 Essential (primary) hypertension: Secondary | ICD-10-CM | POA: Diagnosis not present

## 2024-10-25 DIAGNOSIS — E782 Mixed hyperlipidemia: Secondary | ICD-10-CM | POA: Diagnosis not present

## 2024-10-25 MED ORDER — TICAGRELOR 60 MG PO TABS: 60.0000 mg | ORAL_TABLET | Freq: Two times a day (BID) | ORAL | 3 refills | Status: AC

## 2024-10-25 MED ORDER — CARVEDILOL 12.5 MG PO TABS: 12.5000 mg | ORAL_TABLET | Freq: Two times a day (BID) | ORAL | 3 refills | Status: AC

## 2024-10-25 MED ORDER — ISOSORBIDE MONONITRATE ER 30 MG PO TB24: 15.0000 mg | ORAL_TABLET | Freq: Every day | ORAL | 3 refills | Status: AC

## 2024-10-25 NOTE — Patient Instructions (Signed)
 Medication Instructions:  Your physician recommends the following medication changes.  STOP TAKING: Aspirin  81 mg  CONTINUE: Coreg  12.5 mg twice daily  DECREASE: Brilenta to 60 mg twice daily Imdur  to 15 mg daily  *If you need a refill on your cardiac medications before your next appointment, please call your pharmacy*  Lab Work: No labs ordered today  If you have labs (blood work) drawn today and your tests are completely normal, you will receive your results only by: MyChart Message (if you have MyChart) OR A paper copy in the mail If you have any lab test that is abnormal or we need to change your treatment, we will call you to review the results.  Testing/Procedures: No test ordered today   Follow-Up: At [facility], you and your health needs are our priority.  As part of our continuing mission to provide you with exceptional heart care, our providers are all part of one team.  This team includes your primary Cardiologist (physician) and Advanced Practice Providers or APPs (Physician Assistants and Nurse Practitioners) who all work together to provide you with the care you need, when you need it.  Your next appointment:   3 month(s)  Provider:   You may see Alm Clay, MD one of the following Advanced Practice Providers on your designated Care Team:   Tylene Lunch, NP

## 2024-10-25 NOTE — Progress Notes (Signed)
 Cardiology Office Note   Date:  10/25/2024  ID:  Kent Floyd, DOB [birth year], MRN [id number] PCP: [facility] Providers Cardiologist:  Darryle ONEIDA Decent, MD Cardiology APP:  Gerard Frederick, NP     History of Present Illness Kent Floyd is a 53 y.o. male with past medical history of coronary artery disease status post NSTEMI (10/24), hypertension, hyperlipidemia, who presents today for follow-up of his coronary artery disease.   He presented to [facility] emergency department on 10/15/2023 with additional high-sensitivity troponin of 294 that increased to 325.  CTA of the chest was performed to rule out pulmonary embolism.  [facility] note, ECG was without any signs of ischemia or infarct.  Patient was given high-dose aspirin  and initiated on heparin  infusion after bolus.  He was subsequently transferred to [facility].  He stated that his symptoms started around 10 AM while [place] he suddenly noticed a burning sensation on the left side of his chest and went into his shoulder blades.  He said had associated symptoms of sweating and lightheadedness that lasted a few minutes then resolved.  He states he been evaluated [place] emergency medical services [place] the [place] for which she was given aspirin  and an ECG.  He did not bring that to the emergency department on his arrival.  Due to ongoing chest pain he send again his local emergency department for further evaluation.  He states a similar episode of chest pain occurred last week while at work that lasted a few minutes.  He said a total of 3 different episodes of chest discomfort in the last few months.  States that he has been seen by his primary care provider on a regular basis for borderline hypertension which she is being monitored for.  Initial blood pressure was 163/97, pulse 79, respirations 17, temperature 98.2.  CBC was  unremarkable.  BMP showed elevated glucose.  Lipid panel revealed LDL of 97. He was continued on heparin  infusion and was taken to the cardiac Cath Lab on 10/17/2023 for diagnostic heart catheterization which revealed the culprit lesion is a proximal LAD lesion that was 80% stenosis where he went successful placement of DES.  Mid LAD lesion was 40%, ramus lesion was 50% stenosed, proximal circumflex to mid circumflex lesion 30% stenosis of the proximal sidebranch of the first marginal, LV end-diastolic pressure was normal, there was no aortic valve stenosis.  Recommendations were to continue uninterrupted DAPT with aspirin  and Brilinta  for minimum of 12 months.  Echocardiogram revealed an LVEF of 60 to 65%, mild LVH, and no valvular abnormalities.  Hospitalization was complicated by vasovagal reaction post left heart catheterization which required an extended hospital stay.  He was continued on appropriate medication regimen and was subsequently considered stable for discharge 10/18/2023.  He was seen in clinic 12/21/2023 where he was doing well from a cardiac perspective.  Since reducing his previous dose of isosorbide  alone any dizziness or lightheadedness.  No further medication changes were made and no further testing was needed at that time.  He was was previously seen in clinic 05/08/2024 doing well with cardiac perspective.  No further episodes with reduction of Imdur .  No recurrence of chest discomfort or shortness of breath.  There were no medication changes.  No further testing was ordered at that time.  He was last seen in clinic 07/05/2024 for requesting to be seen due to complaints of  chest discomfort and labile blood pressures.  He was accompanied by his wife.  He had stated that previous Tuesday that he had started to note elevated blood pressures was led to having pain between his shoulder blades in his back that he applies to the neck to the occipital lobe in his head.  He was scheduled for  cardiac PET stress but unfortunately insurance declined coverage and was changed to Lexiscan Myoview .  He was also sent for labs.   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  He states he was stung by bee 10 times.  In had signs and symptoms of angioedema.  Was treated with epinephrine , dexamethasone , Benadryl , famotidine , had a second dose of epinephrine  and ended up being started on an epinephrine  drip and now was admitted to the ICU. Beta-blocker therapy was discontinued and Imdur  was increased.  He returns clinic today stating overall he has been doing well from a cardiac perspective.  He denies any chest pain, shortness of breath dyspnea exertion peripheral edema.  He denies any lightheadedness or dizziness.  He states that he does occasionally have a cold feeling to the center of his chest where he has to go outside and walk around and warm to make it go away.  He states that he has been compliant with his current medication regimen without any undue side effects.  Unfortunately during his hospitalization beta-blocker therapy was to be discontinued be continued taking the medication once he got home and he is also continued on the lower dose of Imdur  as he did not tolerate higher doses.  Continues to have bruising from being on dual antiplatelet therapy.  States that he has been compliant with medications that he has been on without undue side effects.  ROS: 10 point review of systems has been reviewed and considered negative the exception was been listed in the HPI  Studies Reviewed     Lexiscan MPI 08/14/2024   Normal exercise myocardial perfusion stress test without evidence of significant ischemia or scar.   Left ventricular systolic function is normal (LVEF > 65%).   Stent noted in the LAD on the attenuation correction CT.   This is a low-risk study.  Cath: 10/17/2023    CULPRIT LESION: Prox LAD lesion is 80% stenosed with 80% stenosed  side branch in 1st Sept.   A drug-eluting stent was successfully placed using a SYNERGY XD [id number].  Postdilated to 4.1 mm; Post intervention, there is a 0% residual stenosis and the side branch was preserved at 80% residual stenosis., TIMI-3 flow maintained in both main branch and sidebranches.   Mid LAD lesion is 40% stenosed.   -----------------------------------------------------   Ramus lesion is 50% stenosed.   Prox Cx to Mid (dominant) Cx lesion is 30% stenosed proximal to side branch in 1st Mrg.   -----------------------------------------------------   LV end diastolic pressure is normal.   There is no aortic valve stenosis.   POST CATH DIAGNOSES Left dominant system with severe single-vessel disease-80% stenosis in the proximal LAD (at small 1st Diag/SP1). Successful DES PCI with Synergy DES 3.5 x 16 mm -> post-dilated to 4.1 mm.  (Lesion reduced to 0% with TIMI-3 flow maintained) Otherwise 50% proximal ramus intermedius and 30% proximal dominant LCx disease but no significant stenosis. Normal LVEDP with normal EF by Echo.  RECOMMENDATIONS   In the absence of any other complications or medical issues, we expect the patient to be ready for discharge from an  interventional cardiology perspective on 10/17/2023.   Ensure the patient is on appropriate GDMT as tolerated including high-dose statin, beta-blocker, ARB. Assess for other CRF's including DM-2, and elevated LP(a)   Recommend uninterrupted dual antiplatelet therapy with Aspirin  81mg  daily and Ticagrelor  90mg  twice daily for a minimum of 12 months (ACS-Class I recommendation).   For urgent procedures, would be potentially okay to hold DAPT (ASA 81 mg, Brilinta  90 mg twice daily) after 3 months, but but otherwise prefer to at least wait 6 months if possible.  (Patient has pending foot surgery scheduled for November 2024 which will need to be rescheduled.) For bruising would be okay to hold/stop aspirin  after 6 months. Given proximal LAD  location, would consider continuing second year of SAPT -monotherapy with either Brilinta  60 mg twice daily or clopidogrel 75 mg daily   Echo: 10/16/2023  1. Left ventricular ejection fraction, by estimation, is 60 to 65%. The  left ventricle has normal function. The left ventricle has no regional  wall motion abnormalities. There is mild left ventricular hypertrophy.  Left ventricular diastolic parameters  were normal.   2. Right ventricular systolic function is normal. The right ventricular  size is normal. Tricuspid regurgitation signal is inadequate for assessing  PA pressure.   3. The mitral valve is normal in structure. No evidence of mitral valve  regurgitation.   4. The aortic valve is tricuspid. Aortic valve regurgitation is not  visualized. No aortic stenosis is present.   5. The inferior vena cava is normal in size with greater than 50%  respiratory variability, suggesting right atrial pressure of 3 mmHg.   Risk Assessment/Calculations           Physical Exam VS:  BP 120/86 (BP Location: Right Arm, Patient Position: Sitting, Cuff Size: Normal)   Pulse 78   Ht 6' 2 (1.88 m)   Wt 218 lb (98.9 kg)   SpO2 97%   BMI 27.99 kg/m        Wt Readings from Last 3 Encounters:  10/25/24 218 lb (98.9 kg)  07/09/24 218 lb 7.6 oz (99.1 kg)  07/05/24 214 lb 8 oz (97.3 kg)    GEN: Well nourished, well developed in no acute distress NECK: No JVD; No carotid bruits CARDIAC: RRR, no murmurs, rubs, gallops RESPIRATORY:  Clear to auscultation without rales, wheezing or rhonchi  ABDOMEN: Soft, non-tender, non-distended EXTREMITIES:  No edema; No deformity   ASSESSMENT AND PLAN Coronary artery disease native coronary artery without angina.  Left heart catheterization was completed 09/2023 which was successfully treated with PCI/DES side to the proximal LAD.  Recommendation was to continue DAPT with aspirin  and Brilinta  for minimum of 12 months given the proximal LAD location to  consider continuing second year of SAPT monotherapy with Brilinta  60 mg twice daily or clopidogrel 75 mg daily.  Previous Lexiscan Myoview  stress testing revealed no ischemia and was considered a low risk scan.  Today Brilinta  has been decreased to 60 mg twice daily and aspirin  has been discontinued to help with bruising.  Mixed hyperlipidemia with last LDL 60.  LDL continues to decline.  He has been continued on atorvastatin  40 mg daily.  Primary hypertension with a blood pressure of 122/86.  He has been continued on Imdur  15 mg daily and has also continued his carvedilol  12.5 mg daily.Continue to monitor blood pressure 1-2 postmedication administration at home.  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  Previously carvedilol  was discontinued in the  hospital patient continued with beta-blocker therapy after discharge, at that time as he did not want to make medication changes.  Continued on carvedilol  12.5 mg twice daily today but on return we will look at changing to a different medication as beta-blockers can blunt the effectiveness of epinephrine  were he to have another anaphylactic reaction       Dispo: Patient to return to clinic to see primary cardiologist Dr. Anner in 3 months or sooner if needed for further evaluation  Signed, Tip Atienza, NP

## 2025-01-31 ENCOUNTER — Ambulatory Visit: Admitting: Cardiology
